# Patient Record
Sex: Female | Born: 1965 | Race: White | Hispanic: No | Marital: Married | Smoking: Never smoker
Health system: Southern US, Community
[De-identification: ages and names within clinical notes are randomized; demographics above are authoritative.]

## PROBLEM LIST (undated history)

## (undated) DIAGNOSIS — G43829 Menstrual migraine, not intractable, without status migrainosus: Secondary | ICD-10-CM

## (undated) DIAGNOSIS — H3530 Unspecified macular degeneration: Secondary | ICD-10-CM

## (undated) DIAGNOSIS — Z9289 Personal history of other medical treatment: Secondary | ICD-10-CM

## (undated) DIAGNOSIS — G4700 Insomnia, unspecified: Secondary | ICD-10-CM

## (undated) DIAGNOSIS — Z8249 Family history of ischemic heart disease and other diseases of the circulatory system: Secondary | ICD-10-CM

## (undated) DIAGNOSIS — Z1211 Encounter for screening for malignant neoplasm of colon: Secondary | ICD-10-CM

## (undated) HISTORY — DX: Personal history of other medical treatment: Z92.89

## (undated) HISTORY — DX: Menstrual migraine, not intractable, without status migrainosus: G43.829

## (undated) HISTORY — DX: Family history of ischemic heart disease and other diseases of the circulatory system: Z82.49

## (undated) HISTORY — DX: Encounter for screening for malignant neoplasm of colon: Z12.11

## (undated) HISTORY — DX: Unspecified macular degeneration: H35.30

## (undated) HISTORY — DX: Insomnia, unspecified: G47.00

---

## 1997-11-22 HISTORY — PX: OPEN REDUCTION INTERNAL FIXATION (ORIF) TIBIA/FIBULA FRACTURE: SHX5992

## 2006-09-15 ENCOUNTER — Ambulatory Visit: Payer: Self-pay

## 2007-09-19 ENCOUNTER — Ambulatory Visit: Payer: Self-pay | Admitting: Obstetrics and Gynecology

## 2008-10-03 ENCOUNTER — Ambulatory Visit: Payer: Self-pay

## 2009-10-07 ENCOUNTER — Ambulatory Visit: Payer: Self-pay

## 2009-10-14 ENCOUNTER — Ambulatory Visit: Payer: Self-pay

## 2010-10-08 ENCOUNTER — Ambulatory Visit: Payer: Self-pay | Admitting: Unknown Physician Specialty

## 2011-10-11 ENCOUNTER — Ambulatory Visit: Payer: Self-pay

## 2012-10-12 ENCOUNTER — Ambulatory Visit: Payer: Self-pay

## 2013-10-16 ENCOUNTER — Ambulatory Visit: Payer: Self-pay

## 2014-10-22 ENCOUNTER — Ambulatory Visit: Payer: Self-pay

## 2015-11-22 LAB — HM PAP SMEAR

## 2016-09-22 ENCOUNTER — Other Ambulatory Visit: Payer: Self-pay | Admitting: Obstetrics and Gynecology

## 2016-09-22 DIAGNOSIS — Z1231 Encounter for screening mammogram for malignant neoplasm of breast: Secondary | ICD-10-CM

## 2016-11-18 ENCOUNTER — Ambulatory Visit
Admission: RE | Admit: 2016-11-18 | Discharge: 2016-11-18 | Disposition: A | Discharge status: Home / Self Care | Payer: 59 | Source: Ambulatory Visit | Attending: Obstetrics and Gynecology | Admitting: Obstetrics and Gynecology

## 2016-11-18 ENCOUNTER — Encounter: Payer: Self-pay | Admitting: Radiology

## 2016-11-18 DIAGNOSIS — Z1231 Encounter for screening mammogram for malignant neoplasm of breast: Secondary | ICD-10-CM | POA: Insufficient documentation

## 2016-11-18 LAB — HM MAMMOGRAPHY

## 2016-12-01 DIAGNOSIS — Z13220 Encounter for screening for lipoid disorders: Secondary | ICD-10-CM | POA: Diagnosis not present

## 2016-12-01 DIAGNOSIS — Z1239 Encounter for other screening for malignant neoplasm of breast: Secondary | ICD-10-CM | POA: Diagnosis not present

## 2016-12-01 DIAGNOSIS — Z0000 Encounter for general adult medical examination without abnormal findings: Secondary | ICD-10-CM | POA: Diagnosis not present

## 2016-12-01 DIAGNOSIS — Z01419 Encounter for gynecological examination (general) (routine) without abnormal findings: Secondary | ICD-10-CM | POA: Diagnosis not present

## 2016-12-02 DIAGNOSIS — H1045 Other chronic allergic conjunctivitis: Secondary | ICD-10-CM | POA: Diagnosis not present

## 2017-03-15 ENCOUNTER — Other Ambulatory Visit: Payer: Self-pay | Admitting: Obstetrics and Gynecology

## 2017-03-15 DIAGNOSIS — Z86018 Personal history of other benign neoplasm: Secondary | ICD-10-CM | POA: Diagnosis not present

## 2017-03-15 DIAGNOSIS — G4700 Insomnia, unspecified: Secondary | ICD-10-CM | POA: Insufficient documentation

## 2017-03-15 DIAGNOSIS — B078 Other viral warts: Secondary | ICD-10-CM | POA: Diagnosis not present

## 2017-03-15 DIAGNOSIS — F5101 Primary insomnia: Secondary | ICD-10-CM

## 2017-03-15 DIAGNOSIS — Z872 Personal history of diseases of the skin and subcutaneous tissue: Secondary | ICD-10-CM | POA: Diagnosis not present

## 2017-03-15 MED ORDER — ZOLPIDEM TARTRATE 10 MG PO TABS: ORAL_TABLET | ORAL | 0 refills | Status: DC

## 2017-06-10 ENCOUNTER — Telehealth: Payer: Self-pay

## 2017-06-10 NOTE — Telephone Encounter (Signed)
Pt called triage line stating she sees [facility] and they think she is going through menopause. She has not had a cycle since January. She has started having bright red and heavy bleeding. No pain. Please advise

## 2017-06-13 NOTE — Telephone Encounter (Signed)
Pt is schedule 06/16/17

## 2017-06-16 ENCOUNTER — Encounter: Payer: Self-pay | Admitting: Obstetrics and Gynecology

## 2017-06-16 ENCOUNTER — Ambulatory Visit (INDEPENDENT_AMBULATORY_CARE_PROVIDER_SITE_OTHER): Payer: 59 | Admitting: Obstetrics and Gynecology

## 2017-06-16 VITALS — BP 118/70 | HR 51 | Ht 63.0 in | Wt 140.0 lb

## 2017-06-16 DIAGNOSIS — N938 Other specified abnormal uterine and vaginal bleeding: Secondary | ICD-10-CM

## 2017-06-16 DIAGNOSIS — N951 Menopausal and female climacteric states: Secondary | ICD-10-CM

## 2017-06-16 DIAGNOSIS — F5101 Primary insomnia: Secondary | ICD-10-CM

## 2017-06-16 MED ORDER — ZOLPIDEM TARTRATE 10 MG PO TABS: ORAL_TABLET | ORAL | 0 refills | Status: DC

## 2017-06-16 NOTE — Progress Notes (Signed)
Chief Complaint  Patient presents with  . Vaginal Bleeding    vaginal bleeding x2 weeks first time since January    HPI:      [name] is a 51 y.o. [id number] who LMP was Patient's last menstrual period was 05/31/2017., presents today for vaginal bleeding. Pt was seen 12/17 for annual and was taken off OCPs since menses were infrequent. Pt had 1/18 withdrawal bleed and then no bleeding until 05/31/17. Bleeding started as spotting for a week and then she had a few days of heavier flow (changing tampons about every 3-4 hrs) with clots. No dysmen.  Bleeding stopped 06/13/17 and she has been fine since. She is sex active, no bleeding with sex. No pelvic pain, occas vasomotor sx.   Past Medical History:  Diagnosis Date  . History of mammogram [id number]; 11/18/16   birads 1; neg  . History of Papanicolaou smear of cervix 08/31/12; 11/22/15   -/-; -/-  . Insomnia   . Menstrual migraine     Past Surgical History:  Procedure Laterality Date  . OPEN REDUCTION INTERNAL FIXATION (ORIF) TIBIA/FIBULA FRACTURE  1999    Family History  Problem Relation Age of Onset  . Alzheimer's disease Mother        doesn't carry genetic gene  . Hyperlipidemia Mother   . Hypertension Mother   . Heart disease Father 70       MI  . CVA Maternal Grandfather   . CVA Paternal Grandfather   . Breast cancer Neg Hx     Social History   Social History  . Marital status: Married    Spouse name: N/A  . Number of children: 2  . Years of education: 14   Occupational History  . OFFICE MANAGER    Social History Main Topics  . Smoking status: Never Smoker  . Smokeless tobacco: Never Used  . Alcohol use No  . Drug use: No  . Sexual activity: Yes   Other Topics Concern  . Not on file   Social History Narrative  . No narrative on file     Current Outpatient Prescriptions:  .  zolpidem (AMBIEN) 10 MG tablet, Take 1/2 tab at bedtime prn sx, Disp: 30 tablet, Rfl: 0   ROS:  Review of  Systems  Constitutional: Negative for fever.  Gastrointestinal: Negative for blood in stool, constipation, diarrhea, nausea and vomiting.  Genitourinary: Positive for menstrual problem. Negative for dyspareunia, dysuria, flank pain, frequency, hematuria, urgency, vaginal bleeding, vaginal discharge and vaginal pain.  Musculoskeletal: Negative for back pain.  Skin: Negative for rash.     OBJECTIVE:   Vitals:  BP 118/70 (BP Location: Left Arm, Patient Position: Sitting, Cuff Size: Normal)   Pulse (!) 51   Ht 5\' 3"  (1.6 m)   Wt 140 lb (63.5 kg)   LMP 05/31/2017   BMI 24.80 kg/m   Physical Exam  Constitutional: She is oriented to person, place, and time and well-developed, well-nourished, and in no distress. Vital signs are normal.  Genitourinary: Vagina normal, uterus normal, cervix normal, right adnexa normal, left adnexa normal and vulva normal. Uterus is not enlarged. Cervix exhibits no motion tenderness and no tenderness. Right adnexum displays no mass and no tenderness. Left adnexum displays no mass and no tenderness. Vulva exhibits no erythema, no exudate, no lesion, no rash and no tenderness. Vagina exhibits no lesion.  Neurological: She is oriented to person, place, and time.  Vitals reviewed.   Assessment/Plan: DUB (dysfunctional uterine bleeding) -  7/18. Sx resolved. Neg exam. Pt is perimenopausal. Will cont to follow sx. If sx recur, will check labs/u/s. Pt to f/u with sx prn.  Perimenopause  Primary insomnia - Rx RF ambien per pt request. - Plan: zolpidem (AMBIEN) 10 MG tablet    Meds ordered this encounter  Medications  . zolpidem (AMBIEN) 10 MG tablet    Sig: Take 1/2 tab at bedtime prn sx    Dispense:  30 tablet    Refill:  0      Return if symptoms worsen or fail to improve.  Alicia B. Copland, PA-C 06/16/2017 1:37 PM

## 2017-07-06 ENCOUNTER — Encounter: Payer: Self-pay | Admitting: Obstetrics and Gynecology

## 2017-08-16 DIAGNOSIS — Z23 Encounter for immunization: Secondary | ICD-10-CM | POA: Diagnosis not present

## 2017-09-08 ENCOUNTER — Other Ambulatory Visit: Payer: Self-pay | Admitting: Obstetrics and Gynecology

## 2017-09-08 DIAGNOSIS — Z1231 Encounter for screening mammogram for malignant neoplasm of breast: Secondary | ICD-10-CM

## 2017-09-08 DIAGNOSIS — F5101 Primary insomnia: Secondary | ICD-10-CM

## 2017-09-08 MED ORDER — ZOLPIDEM TARTRATE 10 MG PO TABS
ORAL_TABLET | ORAL | 0 refills | Status: DC
Start: 2017-09-08 — End: 2017-12-07

## 2017-11-21 ENCOUNTER — Encounter: Payer: Self-pay | Admitting: Obstetrics and Gynecology

## 2017-11-21 ENCOUNTER — Ambulatory Visit (INDEPENDENT_AMBULATORY_CARE_PROVIDER_SITE_OTHER): Payer: 59 | Admitting: Obstetrics and Gynecology

## 2017-11-21 ENCOUNTER — Ambulatory Visit
Admission: RE | Admit: 2017-11-21 | Discharge: 2017-11-21 | Disposition: A | Discharge status: Home / Self Care | Payer: 59 | Source: Ambulatory Visit | Attending: Obstetrics and Gynecology | Admitting: Obstetrics and Gynecology

## 2017-11-21 VITALS — BP 102/70 | HR 66 | Ht 63.0 in | Wt 141.0 lb

## 2017-11-21 DIAGNOSIS — Z1211 Encounter for screening for malignant neoplasm of colon: Secondary | ICD-10-CM

## 2017-11-21 DIAGNOSIS — Z1231 Encounter for screening mammogram for malignant neoplasm of breast: Secondary | ICD-10-CM | POA: Diagnosis not present

## 2017-11-21 DIAGNOSIS — M25512 Pain in left shoulder: Secondary | ICD-10-CM

## 2017-11-21 DIAGNOSIS — G4700 Insomnia, unspecified: Secondary | ICD-10-CM | POA: Diagnosis not present

## 2017-11-21 DIAGNOSIS — Z01419 Encounter for gynecological examination (general) (routine) without abnormal findings: Secondary | ICD-10-CM | POA: Diagnosis not present

## 2017-11-21 DIAGNOSIS — N951 Menopausal and female climacteric states: Secondary | ICD-10-CM | POA: Diagnosis not present

## 2017-11-21 DIAGNOSIS — Z1239 Encounter for other screening for malignant neoplasm of breast: Secondary | ICD-10-CM

## 2017-11-21 LAB — HEMOCCULT GUIAC POC 1CARD (OFFICE): Fecal Occult Blood, POC: NEGATIVE

## 2017-11-21 NOTE — Progress Notes (Addendum)
PCP: Patient, No Pcp Per   Chief Complaint  Patient presents with  . Gynecologic Exam    ?bursitis in shoulder, taking advil, can you rx something stronger?    HPI:      [name] is a 51 y.o. [id number] who LMP was Patient's last menstrual period was 08/05/2017., presents today for her annual examination.  Her menses are irregular due to perimenopause. She does not have intermenstrual bleeding. She had period 1/18, 7/18, 8/18, and 9/18, lasting 4 days. No BTB, dysmen. She is no longer on OCPs and is doing fine off them.   She does have vasomotor sx that are tolerable.  Sex activity: single partner. She does not have vaginal dryness.  Last Pap: November 18, 2015  Results were: no abnormalities /neg HPV DNA.  Hx of STDs: none  Last mammogram: today, Results were: normal--routine follow-up in 12 months There is no FH of breast cancer. There is no FH of ovarian cancer. The patient does not do self-breast exams.  Colonoscopy: declined last yr  Tobacco use: The patient denies current or previous tobacco use. Alcohol use: none Exercise: moderately active  She does not get adequate calcium and Vitamin D in her diet.  She has noted LT shoulder pain since exercising recently. She is taking ibup 800 mg QD to BID with some improvement. She wonders if it's bursitis. She has full ROM but pain with certain movements.   She takes ambien prn insomnia. Doesn't need RF currently.   Past Medical History:  Diagnosis Date  . History of mammogram [id number]; 11/18/16   birads 1; neg  . History of Papanicolaou smear of cervix 08/31/12; 11/22/15   -/-; -/-  . Insomnia   . Menstrual migraine     Past Surgical History:  Procedure Laterality Date  . OPEN REDUCTION INTERNAL FIXATION (ORIF) TIBIA/FIBULA FRACTURE  1999    Family History  Problem Relation Age of Onset  . Alzheimer's disease Mother        doesn't carry genetic gene  . Hyperlipidemia Mother   . Hypertension Mother     . Heart disease Father 23       MI  . CVA Maternal Grandfather   . CVA Paternal Grandfather   . Breast cancer Neg Hx     Social History   Socioeconomic History  . Marital status: Married    Spouse name: Not on file  . Number of children: 2  . Years of education: 47  . Highest education level: Not on file  Social Needs  . Financial resource strain: Not on file  . Food insecurity - worry: Not on file  . Food insecurity - inability: Not on file  . Transportation needs - medical: Not on file  . Transportation needs - non-medical: Not on file  Occupational History  . Occupation: OFFICE MANAGER  Tobacco Use  . Smoking status: Never Smoker  . Smokeless tobacco: Never Used  Substance and Sexual Activity  . Alcohol use: No  . Drug use: No  . Sexual activity: Yes    Birth control/protection: Post-menopausal  Other Topics Concern  . Not on file  Social History Narrative  . Not on file    Current Meds  Medication Sig  . zolpidem (AMBIEN) 10 MG tablet Take 1/2 tab at bedtime prn sx      ROS:  Review of Systems  Constitutional: Negative for fatigue, fever and unexpected weight change.  Respiratory: Negative for cough, shortness of breath and  wheezing.   Cardiovascular: Negative for chest pain, palpitations and leg swelling.  Gastrointestinal: Negative for blood in stool, constipation, diarrhea, nausea and vomiting.  Endocrine: Negative for cold intolerance, heat intolerance and polyuria.  Genitourinary: Negative for dyspareunia, dysuria, flank pain, frequency, genital sores, hematuria, menstrual problem, pelvic pain, urgency, vaginal bleeding, vaginal discharge and vaginal pain.  Musculoskeletal: Positive for arthralgias. Negative for back pain, joint swelling and myalgias.  Skin: Negative for rash.  Neurological: Negative for dizziness, syncope, light-headedness, numbness and headaches.  Hematological: Negative for adenopathy.  Psychiatric/Behavioral: Negative for  agitation, confusion, sleep disturbance and suicidal ideas. The patient is not nervous/anxious.      Objective: BP 102/70   Pulse 66   Ht 5\' 3"  (1.6 m)   Wt 141 lb (64 kg)   LMP 08/05/2017   BMI 24.98 kg/m    Physical Exam  Constitutional: She is oriented to person, place, and time. She appears well-developed and well-nourished.  Genitourinary: Vagina normal and uterus normal. There is no rash or tenderness on the right labia. There is no rash or tenderness on the left labia. No erythema or tenderness in the vagina. No vaginal discharge found. Right adnexum does not display mass and does not display tenderness. Left adnexum does not display mass and does not display tenderness. Cervix does not exhibit motion tenderness or polyp. Uterus is not enlarged or tender.  Neck: Normal range of motion. No thyromegaly present.  Cardiovascular: Normal rate, regular rhythm and normal heart sounds.  No murmur heard. Pulmonary/Chest: Effort normal and breath sounds normal. Right breast exhibits no mass, no nipple discharge, no skin change and no tenderness. Left breast exhibits no mass, no nipple discharge, no skin change and no tenderness.  Abdominal: Soft. There is no tenderness. There is no guarding.  Musculoskeletal: Normal range of motion.  Neurological: She is alert and oriented to person, place, and time. No cranial nerve deficit.  Psychiatric: She has a normal mood and affect. Her behavior is normal.  Vitals reviewed.   Results: Results for orders placed or performed in visit on 11/21/17 (from the past 24 hour(s))  POCT Occult Blood Stool     Status: Normal   Collection Time: 11/21/17  2:38 PM  Result Value Ref Range   Fecal Occult Blood, POC Negative Negative   Card #1 Date     Card #2 Fecal Occult Blod, POC     Card #2 Date     Card #3 Fecal Occult Blood, POC     Card #3 Date      Assessment/Plan:  Encounter for annual routine gynecological examination  Screening for breast  cancer - Pt had mammo today.   Screening for colon cancer - Pt declines colonoscopy. Will consider cologard and f/u if desires ref. - Plan: POCT Occult Blood Stool  Acute pain of left shoulder - D/C exercises that exacerbate sx/ice/Ibup 800 mg TID. F/u with [facility] prn since doesn't have PCP.  Perimenopause - F/u prn DUB.  Insomnia, unspecified type - F/u prn Rx RF ambien.          GYN counsel breast self exam, mammography screening, menopause, adequate intake of calcium and vitamin D, diet and exercise    F/U  Return in about 1 year (around 11/21/2018).  Dash Cardarelli B. Cadee Agro, PA-C 11/21/2017 3:03 PM

## 2017-11-21 NOTE — Patient Instructions (Signed)
I value your feedback and entrusting us with your care. If you get a [facility] patient survey, I would appreciate you taking the time to let us know about your experience today. Thank you! 

## 2017-12-07 ENCOUNTER — Other Ambulatory Visit: Payer: Self-pay | Admitting: Obstetrics and Gynecology

## 2017-12-07 DIAGNOSIS — F5101 Primary insomnia: Secondary | ICD-10-CM

## 2017-12-07 MED ORDER — ZOLPIDEM TARTRATE 10 MG PO TABS: ORAL_TABLET | ORAL | 0 refills | Status: DC

## 2017-12-07 NOTE — Telephone Encounter (Signed)
Please advise 

## 2017-12-07 NOTE — Telephone Encounter (Signed)
Annual was 12/31

## 2018-01-12 IMAGING — MG MM DIGITAL SCREENING BILAT W/ TOMO W/ CAD
9 of 12 series · 9 of 28 positions shown · non-contrast
Comparison: Previous exam(s).

CLINICAL DATA: Screening.

EXAM:
2D DIGITAL SCREENING BILATERAL MAMMOGRAM WITH CAD AND ADJUNCT TOMO

[L CC synth-2D]
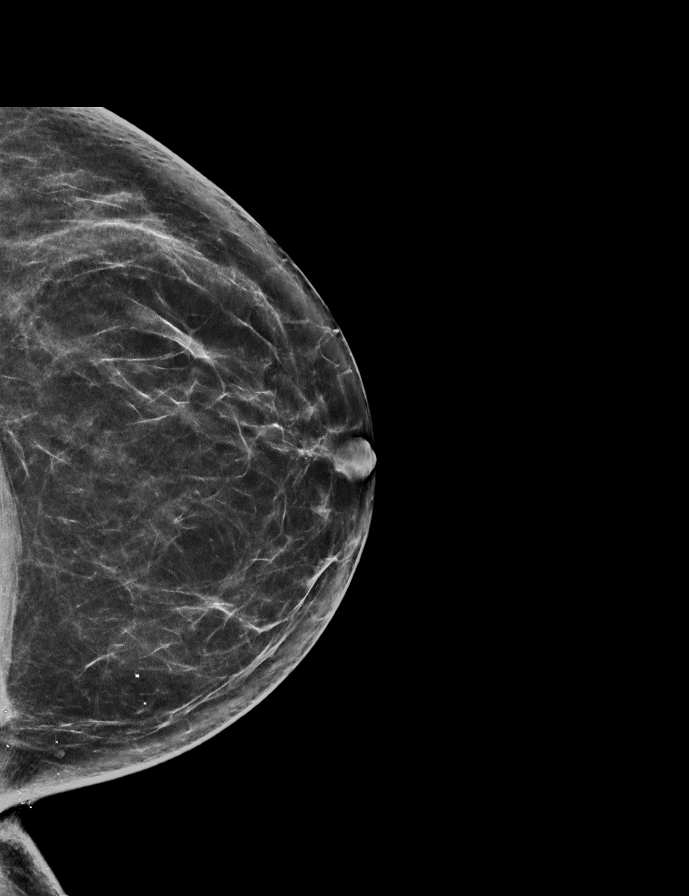

[L MLO]
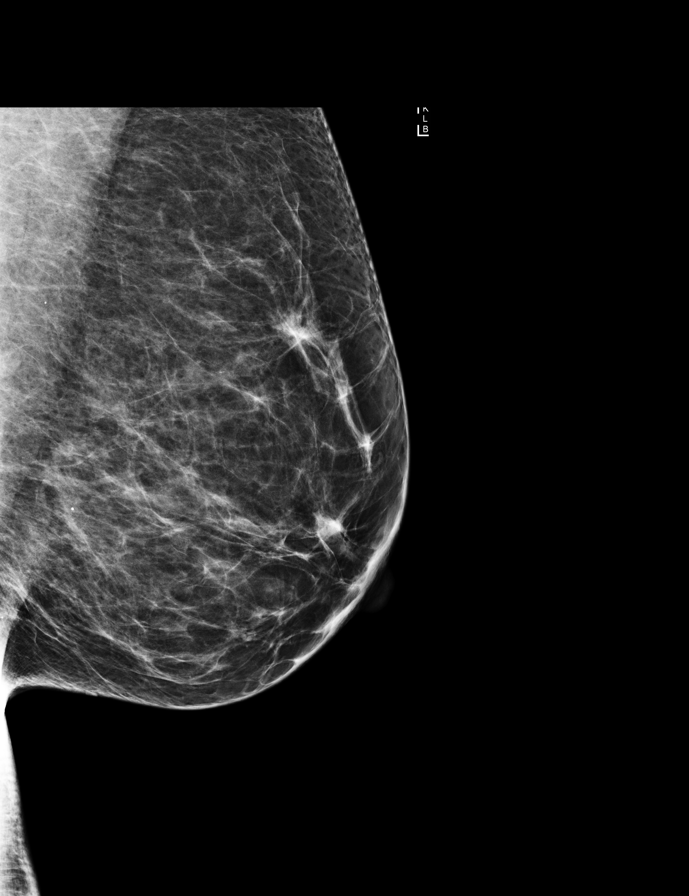

[L CC]
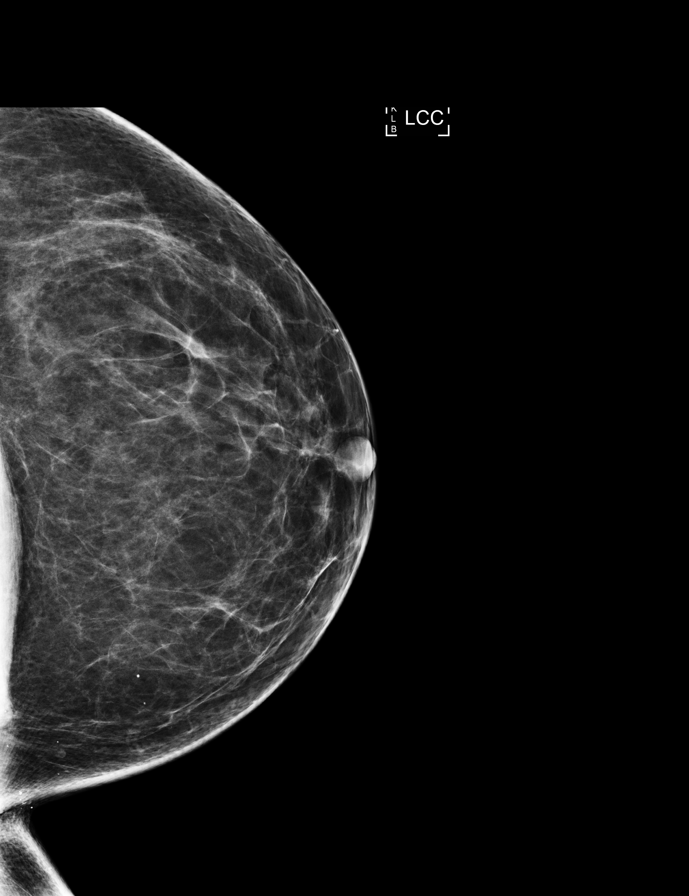

[R CC synth-2D]
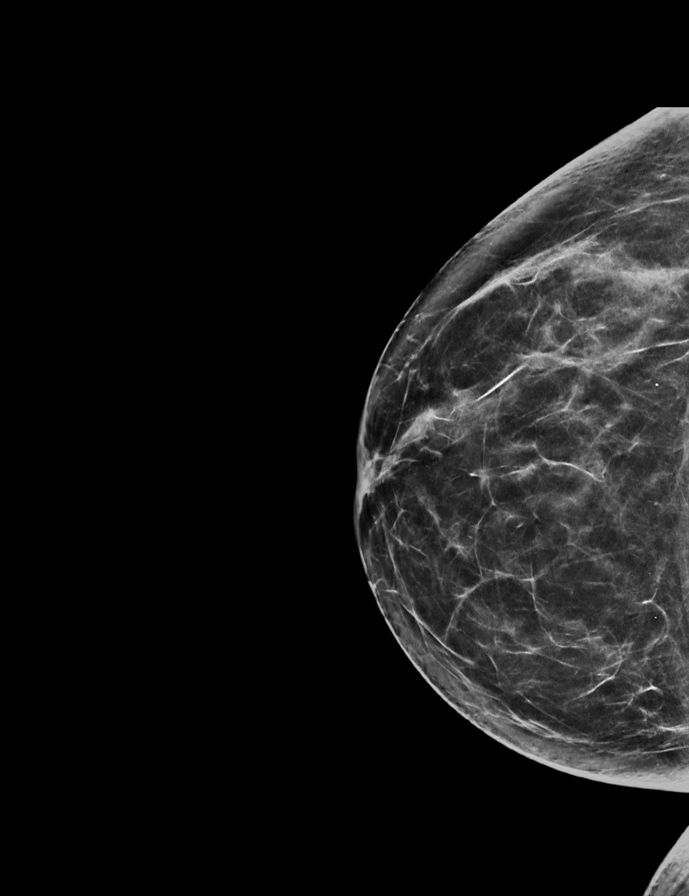

[R MLO]
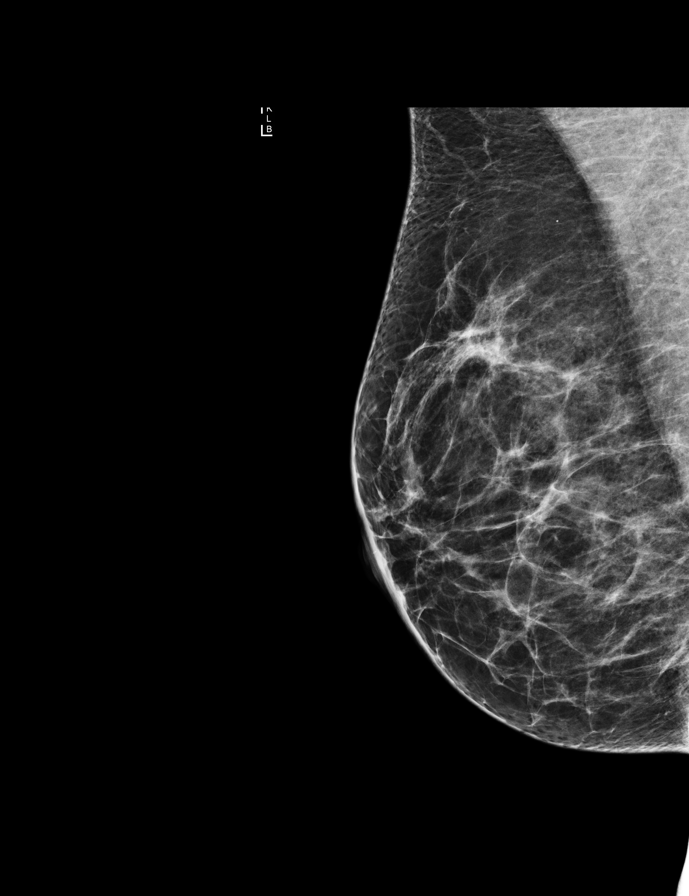

[L MLO synth-2D]
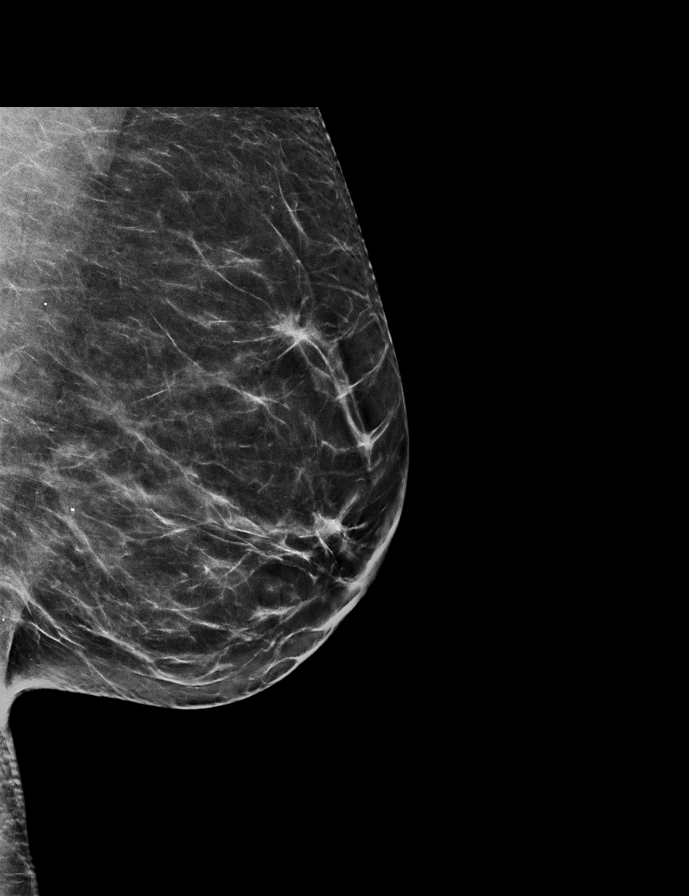

[R MLO synth-2D]
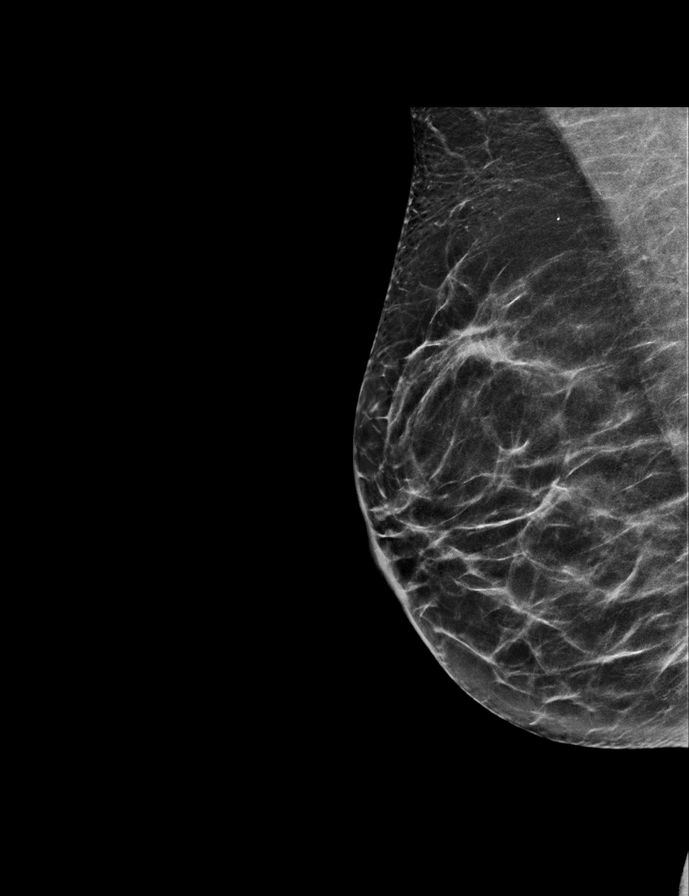

[R CC]
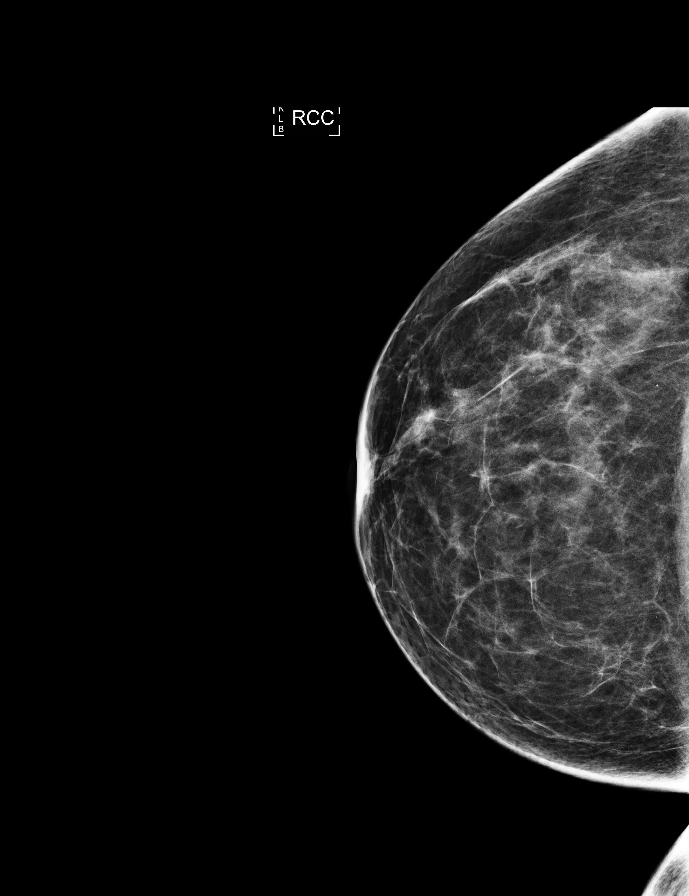

[L MLO tomo · tomo slice 35/70.0]
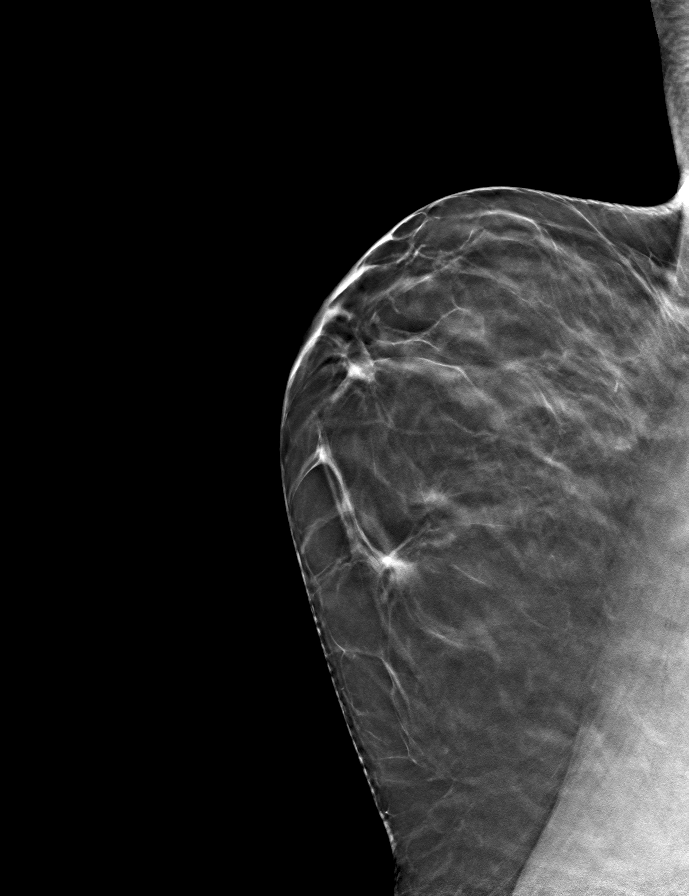

[9 of 28 positions shown; findings below may reference images not displayed]

ACR Breast Density Category b: There are scattered areas of
fibroglandular density.
FINDINGS: There are no findings suspicious for malignancy. Images were
processed with CAD.
IMPRESSION: No mammographic evidence of malignancy. A result letter of this
screening mammogram will be mailed directly to the patient.

RECOMMENDATION:
Screening mammogram in one year. (Code:97-6-RS4)

BI-RADS CATEGORY  1: Negative.

## 2018-02-23 ENCOUNTER — Other Ambulatory Visit: Payer: Self-pay | Admitting: Obstetrics and Gynecology

## 2018-02-23 DIAGNOSIS — F5101 Primary insomnia: Secondary | ICD-10-CM

## 2018-02-23 MED ORDER — ZOLPIDEM TARTRATE 10 MG PO TABS: ORAL_TABLET | ORAL | 0 refills | Status: DC

## 2018-02-23 NOTE — Telephone Encounter (Signed)
Please advise. Thank you

## 2018-03-27 DIAGNOSIS — L578 Other skin changes due to chronic exposure to nonionizing radiation: Secondary | ICD-10-CM | POA: Diagnosis not present

## 2018-03-27 DIAGNOSIS — D485 Neoplasm of uncertain behavior of skin: Secondary | ICD-10-CM | POA: Diagnosis not present

## 2018-03-27 DIAGNOSIS — L281 Prurigo nodularis: Secondary | ICD-10-CM | POA: Diagnosis not present

## 2018-03-27 DIAGNOSIS — Z86018 Personal history of other benign neoplasm: Secondary | ICD-10-CM | POA: Diagnosis not present

## 2018-05-02 ENCOUNTER — Encounter: Payer: Self-pay | Admitting: Obstetrics and Gynecology

## 2018-05-02 DIAGNOSIS — Z131 Encounter for screening for diabetes mellitus: Secondary | ICD-10-CM

## 2018-05-02 DIAGNOSIS — Z13220 Encounter for screening for lipoid disorders: Secondary | ICD-10-CM

## 2018-05-02 DIAGNOSIS — Z0000 Encounter for general adult medical examination without abnormal findings: Secondary | ICD-10-CM

## 2018-05-03 ENCOUNTER — Encounter: Payer: Self-pay | Admitting: Obstetrics and Gynecology

## 2018-05-08 ENCOUNTER — Other Ambulatory Visit: Payer: 59

## 2018-05-08 DIAGNOSIS — Z0000 Encounter for general adult medical examination without abnormal findings: Secondary | ICD-10-CM | POA: Diagnosis not present

## 2018-05-08 DIAGNOSIS — Z13220 Encounter for screening for lipoid disorders: Secondary | ICD-10-CM | POA: Diagnosis not present

## 2018-05-08 DIAGNOSIS — Z131 Encounter for screening for diabetes mellitus: Secondary | ICD-10-CM

## 2018-05-09 LAB — COMPREHENSIVE METABOLIC PANEL
A/G RATIO: 1.7 (ref 1.2–2.2)
ALK PHOS: 46 IU/L (ref 39–117)
ALT: 18 IU/L (ref 0–32)
AST: 22 IU/L (ref 0–40)
Albumin: 4 g/dL (ref 3.5–5.5)
BUN/Creatinine Ratio: 24 — ABNORMAL HIGH (ref 9–23)
BUN: 21 mg/dL (ref 6–24)
Bilirubin Total: <0.2 mg/dL (ref 0.0–1.2)
CALCIUM: 9.5 mg/dL (ref 8.7–10.2)
CHLORIDE: 111 mmol/L — AB (ref 96–106)
CO2: 16 mmol/L — ABNORMAL LOW (ref 20–29)
Creatinine, Ser: 0.89 mg/dL (ref 0.57–1.00)
GFR calc Af Amer: 87 mL/min/{1.73_m2} (ref >59)
GFR, EST NON AFRICAN AMERICAN: 75 mL/min/{1.73_m2} (ref >59)
Globulin, Total: 2.4 g/dL (ref 1.5–4.5)
Glucose: 79 mg/dL (ref 65–99)
POTASSIUM: 5 mmol/L (ref 3.5–5.2)
Sodium: 145 mmol/L — ABNORMAL HIGH (ref 134–144)
Total Protein: 6.4 g/dL (ref 6.0–8.5)

## 2018-05-09 LAB — LIPID PANEL
Chol/HDL Ratio: 2.9 ratio (ref 0.0–4.4)
Cholesterol, Total: 148 mg/dL (ref 100–199)
HDL: 51 mg/dL (ref >39)
LDL Calculated: 84 mg/dL (ref 0–99)
Triglycerides: 64 mg/dL (ref 0–149)
VLDL CHOLESTEROL CAL: 13 mg/dL (ref 5–40)

## 2018-05-09 LAB — HEMOGLOBIN A1C
Est. average glucose Bld gHb Est-mCnc: 111 mg/dL
HEMOGLOBIN A1C: 5.5 % (ref 4.8–5.6)

## 2018-05-15 ENCOUNTER — Other Ambulatory Visit: Payer: Self-pay | Admitting: Obstetrics and Gynecology

## 2018-05-15 DIAGNOSIS — F5101 Primary insomnia: Secondary | ICD-10-CM

## 2018-05-15 MED ORDER — ZOLPIDEM TARTRATE 10 MG PO TABS: ORAL_TABLET | ORAL | 0 refills | Status: DC

## 2018-05-15 NOTE — Telephone Encounter (Signed)
Please advise 

## 2018-06-01 DIAGNOSIS — H353131 Nonexudative age-related macular degeneration, bilateral, early dry stage: Secondary | ICD-10-CM | POA: Diagnosis not present

## 2018-07-05 DIAGNOSIS — H2513 Age-related nuclear cataract, bilateral: Secondary | ICD-10-CM | POA: Diagnosis not present

## 2018-07-05 DIAGNOSIS — H353111 Nonexudative age-related macular degeneration, right eye, early dry stage: Secondary | ICD-10-CM | POA: Diagnosis not present

## 2018-08-08 ENCOUNTER — Other Ambulatory Visit: Payer: Self-pay | Admitting: Obstetrics and Gynecology

## 2018-08-08 DIAGNOSIS — F5101 Primary insomnia: Secondary | ICD-10-CM

## 2018-08-08 MED ORDER — ZOLPIDEM TARTRATE 10 MG PO TABS: ORAL_TABLET | ORAL | 0 refills | Status: DC

## 2018-08-08 NOTE — Telephone Encounter (Signed)
Please advise 

## 2018-08-08 NOTE — Progress Notes (Signed)
Rx RF. 

## 2018-12-08 ENCOUNTER — Other Ambulatory Visit: Payer: Self-pay | Admitting: Obstetrics and Gynecology

## 2018-12-08 DIAGNOSIS — F5101 Primary insomnia: Secondary | ICD-10-CM

## 2018-12-08 NOTE — Telephone Encounter (Signed)
Please advise 

## 2018-12-08 NOTE — Telephone Encounter (Signed)
advise

## 2018-12-11 ENCOUNTER — Other Ambulatory Visit: Payer: Self-pay | Admitting: Obstetrics and Gynecology

## 2018-12-23 DIAGNOSIS — Z1211 Encounter for screening for malignant neoplasm of colon: Secondary | ICD-10-CM

## 2018-12-23 HISTORY — DX: Encounter for screening for malignant neoplasm of colon: Z12.11

## 2019-01-08 NOTE — Progress Notes (Signed)
PCP: Patient, No Pcp Per   Chief Complaint  Patient presents with  . Gynecologic Exam    HPI:      [name] is a 53 y.o. [phone] who LMP was No LMP recorded. (Menstrual status: Perimenopausal)., presents today for her annual examination.  Her menses are absent due to menopause now. She does not have intermenstrual bleeding. LMP 9/18.  She does have vasomotor sx that are tolerable.  Sex activity: single partner. She does not have vaginal dryness.  Last Pap: November 18, 2015  Results were: no abnormalities /neg HPV DNA.  Hx of STDs: none  Last mammogram: 11/21/17  Results were: normal--routine follow-up in 12 months There is no FH of breast cancer. There is no FH of ovarian cancer. The patient does not do self-breast exams.  Colonoscopy: declined last visit, interested in Cologuard. No FH colon cancer/no rectal bleeding.  Tobacco use: The patient denies current or previous tobacco use. Alcohol use: none Exercise: moderately active  She does not get adequate calcium and Vitamin D in her diet.  She takes ambien prn insomnia and needs RF currently.  Normal lipids/labs 6/19.   Past Medical History:  Diagnosis Date  . History of mammogram [id number]; 11/18/16   birads 1; neg  . History of Papanicolaou smear of cervix 08/31/12; 11/22/15   -/-; -/-  . Insomnia   . Menstrual migraine     Past Surgical History:  Procedure Laterality Date  . OPEN REDUCTION INTERNAL FIXATION (ORIF) TIBIA/FIBULA FRACTURE  1999    Family History  Problem Relation Age of Onset  . Alzheimer's disease Mother        doesn't carry genetic gene  . Hyperlipidemia Mother   . Hypertension Mother   . Heart disease Father 28       MI  . CVA Maternal Grandfather   . CVA Paternal Grandfather   . Breast cancer Neg Hx     Social History   Socioeconomic History  . Marital status: Married    Spouse name: Not on file  . Number of children: 2  . Years of education: 47  . Highest  education level: Not on file  Occupational History  . Occupation: OFFICE MANAGER  Social Needs  . Financial resource strain: Not on file  . Food insecurity:    Worry: Not on file    Inability: Not on file  . Transportation needs:    Medical: Not on file    Non-medical: Not on file  Tobacco Use  . Smoking status: Never Smoker  . Smokeless tobacco: Never Used  Substance and Sexual Activity  . Alcohol use: No  . Drug use: No  . Sexual activity: Yes    Birth control/protection: Post-menopausal  Lifestyle  . Physical activity:    Days per week: Not on file    Minutes per session: Not on file  . Stress: Not on file  Relationships  . Social connections:    Talks on phone: Not on file    Gets together: Not on file    Attends religious service: Not on file    Active member of club or organization: Not on file    Attends meetings of clubs or organizations: Not on file    Relationship status: Not on file  . Intimate partner violence:    Fear of current or ex partner: Not on file    Emotionally abused: Not on file    Physically abused: Not on file  Forced sexual activity: Not on file  Other Topics Concern  . Not on file  Social History Narrative  . Not on file    Current Meds  Medication Sig  . meloxicam (MOBIC) 15 MG tablet meloxicam 15 mg tablet  TAKE 1 TABLET BY MOUTH EVERY DAY  . zolpidem (AMBIEN) 10 MG tablet Take 1/2 tab at bedtime prn sx  . [DISCONTINUED] zolpidem (AMBIEN) 10 MG tablet Take 1/2 tab at bedtime prn sx      ROS:  Review of Systems  Constitutional: Negative for fatigue, fever and unexpected weight change.  Respiratory: Negative for cough, shortness of breath and wheezing.   Cardiovascular: Negative for chest pain, palpitations and leg swelling.  Gastrointestinal: Negative for blood in stool, constipation, diarrhea, nausea and vomiting.  Endocrine: Negative for cold intolerance, heat intolerance and polyuria.  Genitourinary: Negative for  dyspareunia, dysuria, flank pain, frequency, genital sores, hematuria, menstrual problem, pelvic pain, urgency, vaginal bleeding, vaginal discharge and vaginal pain.  Musculoskeletal: Negative for arthralgias, back pain, joint swelling and myalgias.  Skin: Negative for rash.  Neurological: Negative for dizziness, syncope, light-headedness, numbness and headaches.  Hematological: Negative for adenopathy.  Psychiatric/Behavioral: Negative for agitation, confusion, sleep disturbance and suicidal ideas. The patient is not nervous/anxious.      Objective: BP 118/78   Pulse (!) 54   Ht 5\' 3"  (1.6 m)   Wt 148 lb (67.1 kg)   BMI 26.22 kg/m    Physical Exam Constitutional:      Appearance: She is well-developed.  Genitourinary:     Vulva, vagina, uterus, right adnexa and left adnexa normal.     No vulval lesion or tenderness noted.     No vaginal discharge, erythema or tenderness.     No cervical motion tenderness or polyp.     Uterus is not enlarged or tender.     No right or left adnexal mass present.     Right adnexa not tender.     Left adnexa not tender.  Neck:     Musculoskeletal: Normal range of motion.     Thyroid: No thyromegaly.  Cardiovascular:     Rate and Rhythm: Normal rate and regular rhythm.     Heart sounds: Normal heart sounds. No murmur.  Pulmonary:     Effort: Pulmonary effort is normal.     Breath sounds: Normal breath sounds.  Chest:     Breasts:        Right: No mass, nipple discharge, skin change or tenderness.        Left: No mass, nipple discharge, skin change or tenderness.  Abdominal:     Palpations: Abdomen is soft.     Tenderness: There is no abdominal tenderness. There is no guarding.  Musculoskeletal: Normal range of motion.  Neurological:     Mental Status: She is alert and oriented to person, place, and time.     Cranial Nerves: No cranial nerve deficit.  Psychiatric:        Behavior: Behavior normal.  Vitals signs reviewed.      Assessment/Plan:  Encounter for annual routine gynecological examination  Cervical cancer screening - Plan: Cytology - PAP  Screening for HPV (human papillomavirus) - Plan: Cytology - PAP  Screening for breast cancer - Pt to sched mammo - Plan: MM 3D SCREEN BREAST BILATERAL  Screening for colon cancer - Cologuard ref sent.  - Plan: Cologuard  Primary insomnia - Rx RF ambien per pt request. - Plan: zolpidem (AMBIEN) 10 MG tablet  Meds ordered this encounter  Medications  . zolpidem (AMBIEN) 10 MG tablet    Sig: Take 1/2 tab at bedtime prn sx    Dispense:  30 tablet    Refill:  0    Order Specific Question:   Supervising Provider    Answer:   Gae Dry [[id number]]    GYN counsel breast self exam, mammography screening, menopause, adequate intake of calcium and vitamin D, diet and exercise    F/U  Return in about 1 year (around 01/10/2020).  Genell Thede B. Keisean Skowron, PA-C 01/09/2019 9:13 AM

## 2019-01-08 NOTE — Patient Instructions (Addendum)
I value your feedback and entrusting us with your care. If you get a [facility] patient survey, I would appreciate you taking the time to let us know about your experience today. Thank you!  [facility]: [phone]    

## 2019-01-09 ENCOUNTER — Other Ambulatory Visit (HOSPITAL_COMMUNITY)
Admission: RE | Admit: 2019-01-09 | Discharge: 2019-01-09 | Disposition: A | Discharge status: Home / Self Care | Payer: 59 | Source: Ambulatory Visit | Attending: Obstetrics and Gynecology | Admitting: Obstetrics and Gynecology

## 2019-01-09 ENCOUNTER — Encounter: Payer: Self-pay | Admitting: Obstetrics and Gynecology

## 2019-01-09 ENCOUNTER — Ambulatory Visit (INDEPENDENT_AMBULATORY_CARE_PROVIDER_SITE_OTHER): Payer: 59 | Admitting: Obstetrics and Gynecology

## 2019-01-09 VITALS — BP 118/78 | HR 54 | Ht 63.0 in | Wt 148.0 lb

## 2019-01-09 DIAGNOSIS — Z124 Encounter for screening for malignant neoplasm of cervix: Secondary | ICD-10-CM | POA: Insufficient documentation

## 2019-01-09 DIAGNOSIS — Z1151 Encounter for screening for human papillomavirus (HPV): Secondary | ICD-10-CM | POA: Diagnosis present

## 2019-01-09 DIAGNOSIS — Z01419 Encounter for gynecological examination (general) (routine) without abnormal findings: Secondary | ICD-10-CM

## 2019-01-09 DIAGNOSIS — Z1239 Encounter for other screening for malignant neoplasm of breast: Secondary | ICD-10-CM

## 2019-01-09 DIAGNOSIS — Z1211 Encounter for screening for malignant neoplasm of colon: Secondary | ICD-10-CM

## 2019-01-09 DIAGNOSIS — F5101 Primary insomnia: Secondary | ICD-10-CM

## 2019-01-09 MED ORDER — ZOLPIDEM TARTRATE 10 MG PO TABS: ORAL_TABLET | ORAL | 0 refills | Status: DC

## 2019-01-10 LAB — CYTOLOGY - PAP
Adequacy: ABSENT
Diagnosis: NEGATIVE
HPV: NOT DETECTED

## 2019-01-18 LAB — COLOGUARD: Cologuard: NEGATIVE

## 2019-01-22 ENCOUNTER — Encounter: Payer: Self-pay | Admitting: Obstetrics and Gynecology

## 2019-02-28 ENCOUNTER — Encounter: Payer: Self-pay | Admitting: Obstetrics and Gynecology

## 2019-03-01 MED ORDER — PROGESTERONE MICRONIZED 100 MG PO CAPS: ORAL_CAPSULE | ORAL | 1 refills | Status: DC

## 2019-03-01 NOTE — Telephone Encounter (Signed)
Spoke with pt. Having increased hot flashes during the day. Tried estroven for 30 days with minimal sx change. Would like to try HRT. Hx of insomnia. Will try prometrium 100 mg QHS 6 nights on, 1 night off. Rx for 2 months. F/u via phone in 2 mos re: sx. May need to adjust Rx prn.

## 2019-04-02 ENCOUNTER — Other Ambulatory Visit: Payer: Self-pay | Admitting: Obstetrics and Gynecology

## 2019-04-02 ENCOUNTER — Encounter: Payer: Self-pay | Admitting: Obstetrics and Gynecology

## 2019-04-02 DIAGNOSIS — F5101 Primary insomnia: Secondary | ICD-10-CM

## 2019-04-02 MED ORDER — ZOLPIDEM TARTRATE 10 MG PO TABS: ORAL_TABLET | ORAL | 0 refills | Status: DC

## 2019-04-02 NOTE — Progress Notes (Signed)
Rx RF ambien.  

## 2019-04-25 ENCOUNTER — Other Ambulatory Visit: Payer: Self-pay | Admitting: Obstetrics and Gynecology

## 2019-05-03 ENCOUNTER — Other Ambulatory Visit: Payer: Self-pay | Admitting: Obstetrics and Gynecology

## 2019-05-03 ENCOUNTER — Encounter: Payer: Self-pay | Admitting: Obstetrics and Gynecology

## 2019-05-03 MED ORDER — PROGESTERONE MICRONIZED 100 MG PO CAPS: ORAL_CAPSULE | ORAL | 2 refills | Status: DC

## 2019-05-03 NOTE — Progress Notes (Signed)
Rx RF prometrium for vasomotor sx.

## 2019-07-16 ENCOUNTER — Other Ambulatory Visit: Payer: Self-pay | Admitting: Obstetrics and Gynecology

## 2019-07-16 DIAGNOSIS — F5101 Primary insomnia: Secondary | ICD-10-CM

## 2019-07-17 MED ORDER — ZOLPIDEM TARTRATE 10 MG PO TABS: ORAL_TABLET | ORAL | 0 refills | Status: DC

## 2019-09-24 ENCOUNTER — Encounter: Payer: Self-pay | Admitting: Obstetrics and Gynecology

## 2019-10-10 ENCOUNTER — Other Ambulatory Visit: Payer: Self-pay | Admitting: Obstetrics and Gynecology

## 2019-10-10 DIAGNOSIS — F5101 Primary insomnia: Secondary | ICD-10-CM

## 2019-10-10 MED ORDER — ZOLPIDEM TARTRATE 10 MG PO TABS: ORAL_TABLET | ORAL | 0 refills | Status: DC

## 2019-10-10 NOTE — Telephone Encounter (Signed)
Please advise 

## 2019-10-15 ENCOUNTER — Other Ambulatory Visit: Payer: Self-pay | Admitting: Obstetrics and Gynecology

## 2019-10-15 ENCOUNTER — Encounter: Payer: Self-pay | Admitting: Obstetrics and Gynecology

## 2019-10-15 MED ORDER — PROGESTERONE MICRONIZED 100 MG PO CAPS: ORAL_CAPSULE | ORAL | 1 refills | Status: DC

## 2019-10-15 NOTE — Progress Notes (Signed)
Rx RF progesterone for 150 mg dose, alternate 100 and 200 mg doses at night. Improve vasomotor sx.

## 2020-01-15 DIAGNOSIS — N951 Menopausal and female climacteric states: Secondary | ICD-10-CM | POA: Insufficient documentation

## 2020-01-15 NOTE — Patient Instructions (Addendum)
I value your feedback and entrusting us with your care. If you get a [facility] patient survey, I would appreciate you taking the time to let us know about your experience today. Thank you!  As of November 01, 2019, your lab results will be released to your MyChart immediately, before I even have a chance to see them. Please give me time to review them and contact you if there are any abnormalities. Thank you for your patience.   [facility]: [phone]  [facility]: [phone]  

## 2020-01-15 NOTE — Progress Notes (Signed)
PCP: Patient, No Pcp Per   Chief Complaint  Patient presents with  . Gynecologic Exam    HPI:      [name] is a 54 y.o. [phone] who LMP was No LMP recorded. (Menstrual status: Perimenopausal)., presents today for her annual examination.  Her menses are absent due to menopause. She does not have intermenstrual bleeding. LMP 9/18.  She does have vasomotor sx that are improved with prometrium 150 mg dose (had to increase from 100 mg dose). Started 11/20.  Sex activity: single partner. She does not have vaginal dryness.  Last Pap: 01/09/19  Results were: no abnormalities /neg HPV DNA.  Hx of STDs: none  Last mammogram: 11/21/17  Results were: normal--routine follow-up in 12 months There is no FH of breast cancer. There is no FH of ovarian cancer. The patient does not do self-breast exams.  Colonoscopy: neg Cologuard 2/20, repeat after 3 yrs  Tobacco use: The patient denies current or previous tobacco use. Alcohol use: none  No drug use Exercise: moderately active  She does not get adequate calcium and Vitamin D in her diet.  She takes [place] about 5 nights a week for insomnia and needs RF currently.  Normal lipids/labs 6/19. FH MI in her dad at age 65. Pt concerned about CVD risk.   Past Medical History:  Diagnosis Date  . Family history of heart disease in female family member before age 54   . History of mammogram [id number]; 11/18/16   birads 1; neg  . History of Papanicolaou smear of cervix 08/31/12; 11/22/15   -/-; -/-  . Insomnia   . Macular degeneration   . Menstrual migraine   . Screening for colon cancer 12/2018   neg Cologuard, repeat in 3 yrs    Past Surgical History:  Procedure Laterality Date  . OPEN REDUCTION INTERNAL FIXATION (ORIF) TIBIA/FIBULA FRACTURE  1999    Family History  Problem Relation Age of Onset  . Alzheimer's disease Mother        doesn't carry genetic gene  . Hyperlipidemia Mother   . Hypertension Mother   . Heart disease  Father 7       MI  . CVA Maternal Grandfather   . CVA Paternal Grandfather   . Breast cancer Neg Hx     Social History   Socioeconomic History  . Marital status: Married    Spouse name: Not on file  . Number of children: 2  . Years of education: 49  . Highest education level: Not on file  Occupational History  . Occupation: OFFICE MANAGER  Tobacco Use  . Smoking status: Never Smoker  . Smokeless tobacco: Never Used  Substance and Sexual Activity  . Alcohol use: No  . Drug use: No  . Sexual activity: Yes    Birth control/protection: Post-menopausal  Other Topics Concern  . Not on file  Social History Narrative  . Not on file   Social Determinants of Health   Financial Resource Strain:   . Difficulty of Paying Living Expenses: Not on file  Food Insecurity:   . Worried About Charity fundraiser in the Last Year: Not on file  . Ran Out of Food in the Last Year: Not on file  Transportation Needs:   . Lack of Transportation (Medical): Not on file  . Lack of Transportation (Non-Medical): Not on file  Physical Activity:   . Days of Exercise per Week: Not on file  . Minutes of Exercise per  Session: Not on file  Stress:   . Feeling of Stress : Not on file  Social Connections:   . Frequency of Communication with Friends and Family: Not on file  . Frequency of Social Gatherings with Friends and Family: Not on file  . Attends Religious Services: Not on file  . Active Member of Clubs or Organizations: Not on file  . Attends Archivist Meetings: Not on file  . Marital Status: Not on file  Intimate Partner Violence:   . Fear of Current or Ex-Partner: Not on file  . Emotionally Abused: Not on file  . Physically Abused: Not on file  . Sexually Abused: Not on file    Current Meds  Medication Sig  . meloxicam (MOBIC) 15 MG tablet meloxicam 15 mg tablet  TAKE 1 TABLET BY MOUTH EVERY DAY  . [DISCONTINUED] progesterone (PROMETRIUM) 100 MG capsule Take 1-2 capsules  nightly, 6 nights on, 1 night off  . [DISCONTINUED] zolpidem (AMBIEN) 10 MG tablet Take 1/2 tab at bedtime prn sx  . progesterone (PROMETRIUM) 100 MG capsule Take 1-2 capsules nightly, 6 nights on, 1 night off  . zolpidem (AMBIEN) 10 MG tablet Take 1/2 tab at bedtime prn sx      ROS:  Review of Systems  Constitutional: Negative for fatigue, fever and unexpected weight change.  Respiratory: Negative for cough, shortness of breath and wheezing.   Cardiovascular: Negative for chest pain, palpitations and leg swelling.  Gastrointestinal: Negative for blood in stool, constipation, diarrhea, nausea and vomiting.  Endocrine: Negative for cold intolerance, heat intolerance and polyuria.  Genitourinary: Negative for dyspareunia, dysuria, flank pain, frequency, genital sores, hematuria, menstrual problem, pelvic pain, urgency, vaginal bleeding, vaginal discharge and vaginal pain.  Musculoskeletal: Negative for arthralgias, back pain, joint swelling and myalgias.  Skin: Negative for rash.  Neurological: Negative for dizziness, syncope, light-headedness, numbness and headaches.  Hematological: Negative for adenopathy.  Psychiatric/Behavioral: Negative for agitation, confusion, sleep disturbance and suicidal ideas. The patient is not nervous/anxious.      Objective: BP 100/70   Ht 5\' 3"  (1.6 m)   Wt 149 lb (67.6 kg)   BMI 26.39 kg/m    Physical Exam Constitutional:      Appearance: She is well-developed.  Genitourinary:     Vulva, vagina, uterus, right adnexa and left adnexa normal.     No vulval lesion or tenderness noted.     No vaginal discharge, erythema or tenderness.     No cervical motion tenderness or polyp.     Uterus is not enlarged or tender.     No right or left adnexal mass present.     Right adnexa not tender.     Left adnexa not tender.  Neck:     Thyroid: No thyromegaly.  Cardiovascular:     Rate and Rhythm: Normal rate and regular rhythm.     Heart sounds: Normal  heart sounds. No murmur.  Pulmonary:     Effort: Pulmonary effort is normal.     Breath sounds: Normal breath sounds.  Chest:     Breasts:        Right: No mass, nipple discharge, skin change or tenderness.        Left: No mass, nipple discharge, skin change or tenderness.  Abdominal:     Palpations: Abdomen is soft.     Tenderness: There is no abdominal tenderness. There is no guarding.  Musculoskeletal:        General: Normal range of motion.  Cervical back: Normal range of motion.  Neurological:     General: No focal deficit present.     Mental Status: She is alert and oriented to person, place, and time.     Cranial Nerves: No cranial nerve deficit.  Skin:    General: Skin is warm and dry.  Psychiatric:        Mood and Affect: Mood normal.        Behavior: Behavior normal.        Thought Content: Thought content normal.        Judgment: Judgment normal.  Vitals reviewed.     Assessment/Plan:  Encounter for annual routine gynecological examination  Encounter for screening mammogram for malignant neoplasm of breast - Plan: MM 3D SCREEN BREAST BILATERAL; pt to sched mammo  Primary insomnia - Plan: zolpidem (AMBIEN) 10 MG tablet; Rx RF.  Hormone replacement therapy (HRT) - Plan: progesterone (PROMETRIUM) 100 MG capsule; Doing well with HRT. Rx RF. F/u prn.  Vasomotor symptoms due to menopause - Plan: progesterone (PROMETRIUM) 100 MG capsule  Family history of heart disease in female family member before age 58 - Plan: Ambulatory referral to Cardiology; pt's husband sees Dr. Nehemiah Massed and wants to go there   Blood tests for routine general physical examination - Plan: Comprehensive metabolic panel, Lipid panel, TSH  Screening cholesterol level - Plan: Lipid panel  Thyroid disorder screening - Plan: TSH         Meds ordered this encounter  Medications  . progesterone (PROMETRIUM) 100 MG capsule    Sig: Take 1-2 capsules nightly, 6 nights on, 1 night off    Dispense:   150 capsule    Refill:  3    Pls hold Rx for now    Order Specific Question:   Supervising Provider    Answer:   Gae Dry [id number]  . zolpidem (AMBIEN) 10 MG tablet    Sig: Take 1/2 tab at bedtime prn sx    Dispense:  30 tablet    Refill:  3    Order Specific Question:   Supervising Provider    Answer:   Gae Dry [id number]    GYN counsel breast self exam, mammography screening, menopause, adequate intake of calcium and vitamin D, diet and exercise    F/U  Return in about 1 year (around 01/15/2021).  Belmira Daley B. Allis Quirarte, PA-C 01/16/2020 9:12 AM

## 2020-01-16 ENCOUNTER — Encounter: Payer: Self-pay | Admitting: Obstetrics and Gynecology

## 2020-01-16 ENCOUNTER — Other Ambulatory Visit: Payer: Self-pay

## 2020-01-16 ENCOUNTER — Ambulatory Visit (INDEPENDENT_AMBULATORY_CARE_PROVIDER_SITE_OTHER): Payer: 59 | Admitting: Obstetrics and Gynecology

## 2020-01-16 VITALS — BP 100/70 | Ht 63.0 in | Wt 149.0 lb

## 2020-01-16 DIAGNOSIS — F5101 Primary insomnia: Secondary | ICD-10-CM | POA: Diagnosis not present

## 2020-01-16 DIAGNOSIS — N951 Menopausal and female climacteric states: Secondary | ICD-10-CM

## 2020-01-16 DIAGNOSIS — Z01419 Encounter for gynecological examination (general) (routine) without abnormal findings: Secondary | ICD-10-CM | POA: Diagnosis not present

## 2020-01-16 DIAGNOSIS — Z1231 Encounter for screening mammogram for malignant neoplasm of breast: Secondary | ICD-10-CM

## 2020-01-16 DIAGNOSIS — Z79890 Hormone replacement therapy (postmenopausal): Secondary | ICD-10-CM

## 2020-01-16 DIAGNOSIS — Z8249 Family history of ischemic heart disease and other diseases of the circulatory system: Secondary | ICD-10-CM

## 2020-01-16 DIAGNOSIS — Z13220 Encounter for screening for lipoid disorders: Secondary | ICD-10-CM

## 2020-01-16 DIAGNOSIS — Z1329 Encounter for screening for other suspected endocrine disorder: Secondary | ICD-10-CM

## 2020-01-16 DIAGNOSIS — Z0000 Encounter for general adult medical examination without abnormal findings: Secondary | ICD-10-CM

## 2020-01-16 MED ORDER — ZOLPIDEM TARTRATE 10 MG PO TABS: ORAL_TABLET | ORAL | 3 refills | Status: DC

## 2020-01-16 MED ORDER — PROGESTERONE MICRONIZED 100 MG PO CAPS: ORAL_CAPSULE | ORAL | 3 refills | Status: DC

## 2020-01-17 LAB — COMPREHENSIVE METABOLIC PANEL
ALT: 18 IU/L (ref 0–32)
AST: 22 IU/L (ref 0–40)
Albumin/Globulin Ratio: 2.4 — ABNORMAL HIGH (ref 1.2–2.2)
Albumin: 4.7 g/dL (ref 3.8–4.9)
Alkaline Phosphatase: 63 IU/L (ref 39–117)
BUN/Creatinine Ratio: 22 (ref 9–23)
BUN: 16 mg/dL (ref 6–24)
Bilirubin Total: 0.4 mg/dL (ref 0.0–1.2)
CO2: 22 mmol/L (ref 20–29)
Calcium: 9.5 mg/dL (ref 8.7–10.2)
Chloride: 108 mmol/L — ABNORMAL HIGH (ref 96–106)
Creatinine, Ser: 0.74 mg/dL (ref 0.57–1.00)
GFR calc Af Amer: 107 mL/min/{1.73_m2} (ref >59)
GFR calc non Af Amer: 93 mL/min/{1.73_m2} (ref >59)
Globulin, Total: 2 g/dL (ref 1.5–4.5)
Glucose: 81 mg/dL (ref 65–99)
Potassium: 4.2 mmol/L (ref 3.5–5.2)
Sodium: 145 mmol/L — ABNORMAL HIGH (ref 134–144)
Total Protein: 6.7 g/dL (ref 6.0–8.5)

## 2020-01-17 LAB — LIPID PANEL
Chol/HDL Ratio: 3.5 ratio (ref 0.0–4.4)
Cholesterol, Total: 188 mg/dL (ref 100–199)
HDL: 53 mg/dL (ref >39)
LDL Chol Calc (NIH): 123 mg/dL — ABNORMAL HIGH (ref 0–99)
Triglycerides: 67 mg/dL (ref 0–149)
VLDL Cholesterol Cal: 12 mg/dL (ref 5–40)

## 2020-01-17 LAB — TSH: TSH: 1.54 u[IU]/mL (ref 0.450–4.500)

## 2020-02-01 ENCOUNTER — Ambulatory Visit
Admission: RE | Admit: 2020-02-01 | Discharge: 2020-02-01 | Disposition: A | Discharge status: Home / Self Care | Payer: 59 | Source: Ambulatory Visit | Attending: Obstetrics and Gynecology | Admitting: Obstetrics and Gynecology

## 2020-02-01 ENCOUNTER — Encounter: Payer: Self-pay | Admitting: Radiology

## 2020-02-01 DIAGNOSIS — Z1231 Encounter for screening mammogram for malignant neoplasm of breast: Secondary | ICD-10-CM | POA: Diagnosis not present

## 2020-02-03 ENCOUNTER — Encounter: Payer: Self-pay | Admitting: Obstetrics and Gynecology

## 2020-02-09 ENCOUNTER — Other Ambulatory Visit: Payer: Self-pay

## 2020-02-09 ENCOUNTER — Ambulatory Visit: Payer: 59 | Attending: Internal Medicine

## 2020-02-09 DIAGNOSIS — Z23 Encounter for immunization: Secondary | ICD-10-CM

## 2020-02-09 NOTE — Progress Notes (Signed)
   Covid-19 Vaccination Clinic  Name:  Rebecca Nunez    MRN: [id number] DOB: July 02, 1966  02/09/2020  [name] was observed post Covid-19 immunization for 15 minutes without incident. She was provided with Vaccine Information Sheet and instruction to access the V-Safe system.   [name] was instructed to call 911 with any severe reactions post vaccine: Marland Kitchen Difficulty breathing  . Swelling of face and throat  . A fast heartbeat  . A bad rash all over body  . Dizziness and weakness   Immunizations Administered    Name Date Dose VIS Date Route   Pfizer COVID-19 Vaccine 02/09/2020 12:36 PM 0.3 mL 11/02/2019 Intramuscular   Manufacturer: [place]   Lot: [id number]   [place]: [id number]

## 2020-02-11 ENCOUNTER — Encounter: Payer: Self-pay | Admitting: Obstetrics and Gynecology

## 2020-03-05 ENCOUNTER — Ambulatory Visit: Payer: 59 | Attending: Internal Medicine

## 2020-03-05 DIAGNOSIS — Z23 Encounter for immunization: Secondary | ICD-10-CM

## 2020-03-05 NOTE — Progress Notes (Signed)
   Covid-19 Vaccination Clinic  Name:  Latera Whillock    MRN: [id number] DOB: [birth year]  03/05/2020  Ms. Alto was observed post Covid-19 immunization for 15 minutes without incident. She was provided with Vaccine Information Sheet and instruction to access the V-Safe system.   Ms. Biviano was instructed to call 911 with any severe reactions post vaccine: Marland Kitchen Difficulty breathing  . Swelling of face and throat  . A fast heartbeat  . A bad rash all over body  . Dizziness and weakness   Immunizations Administered    Name Date Dose VIS Date Route   Pfizer COVID-19 Vaccine 03/05/2020 10:19 AM 0.3 mL 11/02/2019 Intramuscular   Manufacturer: [employer], [employer]   Lot: [id number]   [place]: [id number]

## 2020-03-24 ENCOUNTER — Other Ambulatory Visit: Payer: Self-pay | Admitting: Obstetrics and Gynecology

## 2020-06-20 ENCOUNTER — Telehealth: Payer: Self-pay | Admitting: Nurse Practitioner

## 2020-06-20 DIAGNOSIS — E7800 Pure hypercholesterolemia, unspecified: Secondary | ICD-10-CM

## 2020-06-20 DIAGNOSIS — E781 Pure hyperglyceridemia: Secondary | ICD-10-CM

## 2020-06-20 NOTE — Telephone Encounter (Signed)
Dr. Nehemiah Massed would like a calcium score order for the following patient  Diagnosis code=E78.2   Please order under Dr. Meda Coffee  Dr. Nehemiah Massed will call for results

## 2020-07-04 ENCOUNTER — Other Ambulatory Visit: Payer: Self-pay

## 2020-07-04 ENCOUNTER — Ambulatory Visit (INDEPENDENT_AMBULATORY_CARE_PROVIDER_SITE_OTHER)
Admission: RE | Admit: 2020-07-04 | Discharge: 2020-07-04 | Disposition: A | Discharge status: Home / Self Care | Payer: Self-pay | Source: Ambulatory Visit | Attending: Cardiology | Admitting: Cardiology

## 2020-07-04 DIAGNOSIS — E781 Pure hyperglyceridemia: Secondary | ICD-10-CM

## 2020-07-07 ENCOUNTER — Telehealth: Payer: Self-pay

## 2020-07-07 NOTE — Telephone Encounter (Signed)
LMTCB with questions regarding results-sent to mychart.

## 2020-07-07 NOTE — Telephone Encounter (Signed)
-----   Message from Sueanne Margarita, MD sent at 07/04/2020  5:15 PM EDT ----- Chest CT is normal with coronary Ca score of 0

## 2020-07-16 ENCOUNTER — Other Ambulatory Visit: Payer: Self-pay | Admitting: Obstetrics and Gynecology

## 2020-07-16 DIAGNOSIS — F5101 Primary insomnia: Secondary | ICD-10-CM

## 2020-07-17 ENCOUNTER — Other Ambulatory Visit: Payer: Self-pay | Admitting: Obstetrics and Gynecology

## 2020-07-17 ENCOUNTER — Encounter: Payer: Self-pay | Admitting: Obstetrics and Gynecology

## 2020-07-17 DIAGNOSIS — F5101 Primary insomnia: Secondary | ICD-10-CM

## 2020-07-17 MED ORDER — ZOLPIDEM TARTRATE 10 MG PO TABS: ORAL_TABLET | ORAL | 3 refills | Status: DC

## 2020-07-17 NOTE — Progress Notes (Signed)
Rx RF ambien.

## 2020-08-08 ENCOUNTER — Encounter: Payer: Self-pay | Admitting: Obstetrics and Gynecology

## 2021-01-19 NOTE — Patient Instructions (Addendum)
I value your feedback and you entrusting us with your care. If you get a [facility] patient survey, I would appreciate you taking the time to let us know about your experience today. Thank you!  [facility]: [phone]      

## 2021-01-19 NOTE — Progress Notes (Signed)
PCP: Chad Cordial, PA-C   Chief Complaint  Patient presents with  . Gynecologic Exam    No concerns    HPI:      [name] is a 55 y.o. [id number] who LMP was Patient's last menstrual period was 08/05/2017., presents today for her annual examination.  Her menses are absent due to menopause. She does not have PMB. LMP 9/18.  She does have vasomotor sx that are improved with prometrium 150 mg dose (alternates 1-2 caps). Started 11/20.  Sex activity: single partner. She does not have vaginal dryness.  Last Pap: 01/09/19  Results were: no abnormalities /neg HPV DNA.  Hx of STDs: none  Last mammogram: 02/01/20  Results were: normal--routine follow-up in 12 months There is no FH of breast cancer. There is no FH of ovarian cancer. The patient does not do self-breast exams.  Colonoscopy: neg Cologuard 2/20, repeat after 3 yrs  Tobacco use: The patient denies current or previous tobacco use. Alcohol use: none  No drug use Exercise: moderately active  She does get adequate calcium and Vitamin D in her diet.  She takes ambien 5 mg about 5 nights a week for insomnia and needs RF currently.  Normal lipids/labs 2/21. FH MI in her dad at age 63. Had neg cardio eval with Dr. Nehemiah Massed, taking ASA 81 mg daily. Taking accutane for skin issues. Needs CMP.  Would like varicella IgG because she doesn't know if she had chicken pox. Wants to get shingles vaccine if needed.    Past Medical History:  Diagnosis Date  . Family history of heart disease in female family member before age 46   . History of mammogram [id number]; 11/18/16   birads 1; neg  . History of Papanicolaou smear of cervix 08/31/12; 11/22/15   -/-; -/-  . Insomnia   . Macular degeneration   . Menstrual migraine   . Screening for colon cancer 12/2018   neg Cologuard, repeat in 3 yrs    Past Surgical History:  Procedure Laterality Date  . OPEN REDUCTION INTERNAL FIXATION (ORIF) TIBIA/FIBULA FRACTURE  1999     Family History  Problem Relation Age of Onset  . Alzheimer's disease Mother        doesn't carry genetic gene  . Hyperlipidemia Mother   . Hypertension Mother   . Heart disease Father 58       MI  . CVA Maternal Grandfather   . CVA Paternal Grandfather   . Breast cancer Neg Hx     Social History   Socioeconomic History  . Marital status: Married    Spouse name: Not on file  . Number of children: 2  . Years of education: 75  . Highest education level: Not on file  Occupational History  . Occupation: OFFICE MANAGER  Tobacco Use  . Smoking status: Never Smoker  . Smokeless tobacco: Never Used  Vaping Use  . Vaping Use: Never used  Substance and Sexual Activity  . Alcohol use: No  . Drug use: No  . Sexual activity: Yes    Birth control/protection: Post-menopausal  Other Topics Concern  . Not on file  Social History Narrative  . Not on file   Social Determinants of Health   Financial Resource Strain: Not on file  Food Insecurity: Not on file  Transportation Needs: Not on file  Physical Activity: Not on file  Stress: Not on file  Social Connections: Not on file  Intimate Partner Violence: Not on file  Current Meds  Medication Sig  . calcium carbonate (OS-CAL) 1250 (500 Ca) MG chewable tablet Chew by mouth.  . hydrocortisone 2.5 % ointment Apply 1 application topically 2 (two) times daily.  . ISOtretinoin (ACCUTANE) 40 MG capsule   . meloxicam (MOBIC) 15 MG tablet meloxicam 15 mg tablet  TAKE 1 TABLET BY MOUTH EVERY DAY  . [DISCONTINUED] progesterone (PROMETRIUM) 100 MG capsule progesterone micronized 100 mg capsule  . [DISCONTINUED] zolpidem (AMBIEN) 10 MG tablet Take 1/2 tab at bedtime prn sx  . progesterone (PROMETRIUM) 100 MG capsule Take 1-2 caps nightly, 6 nights on, 1 night off      ROS:  Review of Systems  Constitutional: Negative for fatigue, fever and unexpected weight change.  Respiratory: Negative for cough, shortness of breath and  wheezing.   Cardiovascular: Negative for chest pain, palpitations and leg swelling.  Gastrointestinal: Negative for blood in stool, constipation, diarrhea, nausea and vomiting.  Endocrine: Negative for cold intolerance, heat intolerance and polyuria.  Genitourinary: Negative for dyspareunia, dysuria, flank pain, frequency, genital sores, hematuria, menstrual problem, pelvic pain, urgency, vaginal bleeding, vaginal discharge and vaginal pain.  Musculoskeletal: Negative for arthralgias, back pain, joint swelling and myalgias.  Skin: Negative for rash.  Neurological: Negative for dizziness, syncope, light-headedness, numbness and headaches.  Hematological: Negative for adenopathy.  Psychiatric/Behavioral: Negative for agitation, confusion, sleep disturbance and suicidal ideas. The patient is not nervous/anxious.      Objective: BP 110/70   Ht 5\' 3"  (1.6 m)   Wt 142 lb (64.4 kg)   LMP 08/05/2017   BMI 25.15 kg/m    Physical Exam Constitutional:      Appearance: She is well-developed.  Genitourinary:     Vulva normal.     Right Labia: No rash, tenderness or lesions.    Left Labia: No tenderness, lesions or rash.    No vaginal discharge, erythema or tenderness.      Right Adnexa: not tender and no mass present.    Left Adnexa: not tender and no mass present.    No cervical motion tenderness, friability or polyp.     Uterus is not enlarged or tender.  Breasts:     Right: No mass, nipple discharge, skin change or tenderness.     Left: No mass, nipple discharge, skin change or tenderness.    Neck:     Thyroid: No thyromegaly.  Cardiovascular:     Rate and Rhythm: Normal rate and regular rhythm.     Heart sounds: Normal heart sounds. No murmur heard.   Pulmonary:     Effort: Pulmonary effort is normal.     Breath sounds: Normal breath sounds.  Abdominal:     Palpations: Abdomen is soft.     Tenderness: There is no abdominal tenderness. There is no guarding or rebound.   Musculoskeletal:        General: Normal range of motion.     Cervical back: Normal range of motion.  Lymphadenopathy:     Cervical: No cervical adenopathy.  Neurological:     General: No focal deficit present.     Mental Status: She is alert and oriented to person, place, and time.     Cranial Nerves: No cranial nerve deficit.  Skin:    General: Skin is warm and dry.  Psychiatric:        Mood and Affect: Mood normal.        Behavior: Behavior normal.        Thought Content: Thought content normal.  Judgment: Judgment normal.  Vitals reviewed.     Assessment/Plan:  Encounter for annual routine gynecological examination  Encounter for screening mammogram for malignant neoplasm of breast - Plan: MM 3D SCREEN BREAST BILATERAL; pt to sched mammo  Hormone replacement therapy (HRT) - Plan: progesterone (PROMETRIUM) 100 MG capsule  Vasomotor symptoms due to menopause - Plan: progesterone (PROMETRIUM) 100 MG capsule; Rx RF. F/u prn.   Primary insomnia - Plan: zolpidem (AMBIEN) 10 MG tablet; Rx RF.   No history of varicella vaccination - Plan: Varicella zoster antibody, IgG  On Accutane therapy - Plan: Comprehensive metabolic panel           Meds ordered this encounter  Medications  . zolpidem (AMBIEN) 10 MG tablet    Sig: Take 1/2 tab at bedtime prn sx    Dispense:  30 tablet    Refill:  5    Order Specific Question:   Supervising Provider    Answer:   Gae Dry [id number]  . progesterone (PROMETRIUM) 100 MG capsule    Sig: Take 1-2 caps nightly, 6 nights on, 1 night off    Dispense:  90 capsule    Refill:  2    Order Specific Question:   Supervising Provider    Answer:   Gae Dry [[id number]]    GYN counsel breast self exam, mammography screening, menopause, adequate intake of calcium and vitamin D, diet and exercise    F/U  Return in about 1 year (around 01/20/2022).  Cresencio Reesor B. Sindee Stucker, PA-C 01/20/2021 8:53 AM

## 2021-01-20 ENCOUNTER — Ambulatory Visit (INDEPENDENT_AMBULATORY_CARE_PROVIDER_SITE_OTHER): Payer: 59 | Admitting: Obstetrics and Gynecology

## 2021-01-20 ENCOUNTER — Other Ambulatory Visit: Payer: Self-pay

## 2021-01-20 ENCOUNTER — Encounter: Payer: Self-pay | Admitting: Obstetrics and Gynecology

## 2021-01-20 VITALS — BP 110/70 | Ht 63.0 in | Wt 142.0 lb

## 2021-01-20 DIAGNOSIS — Z1231 Encounter for screening mammogram for malignant neoplasm of breast: Secondary | ICD-10-CM

## 2021-01-20 DIAGNOSIS — Z01419 Encounter for gynecological examination (general) (routine) without abnormal findings: Secondary | ICD-10-CM | POA: Diagnosis not present

## 2021-01-20 DIAGNOSIS — Z79890 Hormone replacement therapy (postmenopausal): Secondary | ICD-10-CM

## 2021-01-20 DIAGNOSIS — Z789 Other specified health status: Secondary | ICD-10-CM

## 2021-01-20 DIAGNOSIS — N951 Menopausal and female climacteric states: Secondary | ICD-10-CM | POA: Diagnosis not present

## 2021-01-20 DIAGNOSIS — Z79899 Other long term (current) drug therapy: Secondary | ICD-10-CM

## 2021-01-20 DIAGNOSIS — F5101 Primary insomnia: Secondary | ICD-10-CM

## 2021-01-20 DIAGNOSIS — Z792 Long term (current) use of antibiotics: Secondary | ICD-10-CM

## 2021-01-20 MED ORDER — ZOLPIDEM TARTRATE 10 MG PO TABS: ORAL_TABLET | ORAL | 5 refills | Status: DC

## 2021-01-20 MED ORDER — PROGESTERONE MICRONIZED 100 MG PO CAPS: ORAL_CAPSULE | ORAL | 2 refills | Status: DC

## 2021-01-21 LAB — COMPREHENSIVE METABOLIC PANEL
ALT: 23 IU/L (ref 0–32)
AST: 34 IU/L (ref 0–40)
Albumin/Globulin Ratio: 2 (ref 1.2–2.2)
Albumin: 4.9 g/dL (ref 3.8–4.9)
Alkaline Phosphatase: 70 IU/L (ref 44–121)
BUN/Creatinine Ratio: 21 (ref 9–23)
BUN: 19 mg/dL (ref 6–24)
Bilirubin Total: 0.3 mg/dL (ref 0.0–1.2)
CO2: 21 mmol/L (ref 20–29)
Calcium: 9.9 mg/dL (ref 8.7–10.2)
Chloride: 103 mmol/L (ref 96–106)
Creatinine, Ser: 0.92 mg/dL (ref 0.57–1.00)
Globulin, Total: 2.5 g/dL (ref 1.5–4.5)
Glucose: 65 mg/dL (ref 65–99)
Potassium: 4.2 mmol/L (ref 3.5–5.2)
Sodium: 143 mmol/L (ref 134–144)
Total Protein: 7.4 g/dL (ref 6.0–8.5)
eGFR: 74 mL/min/{1.73_m2} (ref >59)

## 2021-01-21 LAB — VARICELLA ZOSTER ANTIBODY, IGG: Varicella zoster IgG: 887 index (ref >165)

## 2021-02-03 ENCOUNTER — Encounter: Payer: Self-pay | Admitting: Obstetrics and Gynecology

## 2021-02-03 NOTE — Telephone Encounter (Signed)
Pls send labs from 3/22 to pt's MD. Thx.

## 2021-02-24 ENCOUNTER — Ambulatory Visit
Admission: RE | Admit: 2021-02-24 | Discharge: 2021-02-24 | Disposition: A | Discharge status: Home / Self Care | Payer: 59 | Source: Ambulatory Visit | Attending: Obstetrics and Gynecology | Admitting: Obstetrics and Gynecology

## 2021-02-24 ENCOUNTER — Other Ambulatory Visit: Payer: Self-pay

## 2021-02-24 DIAGNOSIS — Z1231 Encounter for screening mammogram for malignant neoplasm of breast: Secondary | ICD-10-CM | POA: Insufficient documentation

## 2021-07-13 ENCOUNTER — Encounter: Payer: Self-pay | Admitting: Obstetrics and Gynecology

## 2021-07-13 ENCOUNTER — Other Ambulatory Visit: Payer: Self-pay | Admitting: Obstetrics and Gynecology

## 2021-07-13 DIAGNOSIS — N951 Menopausal and female climacteric states: Secondary | ICD-10-CM

## 2021-07-13 DIAGNOSIS — Z79890 Hormone replacement therapy (postmenopausal): Secondary | ICD-10-CM

## 2021-07-20 ENCOUNTER — Other Ambulatory Visit: Payer: Self-pay | Admitting: Obstetrics and Gynecology

## 2021-07-20 DIAGNOSIS — F5101 Primary insomnia: Secondary | ICD-10-CM

## 2022-01-27 NOTE — Progress Notes (Signed)
? ?PCP: Minela Bridgewater, Deirdre Evener, PA-C ? ? ?Chief Complaint  ?Patient presents with  ? Gynecologic Exam  ?  No concerns  ? ? ?HPI: ?     [name] is a 56 y.o. [id number] who LMP was Patient's last menstrual period was 08/05/2017., presents today for her annual examination.  Her menses are absent due to menopause. She does not have PMB. LMP 9/18.  She does have vasomotor sx that are improved with prometrium 150 mg dose (alternates 1-2 caps). Started 11/20. Would like to continue.  ? ?Sex activity: single partner. She does not have vaginal dryness. No pain/bleeding.  ? ?Last Pap: 01/09/19  Results were: no abnormalities /neg HPV DNA.  ?Hx of STDs: none ? ?Last mammogram: 02/24/21  Results were: normal--routine follow-up in 12 months ?There is no FH of breast cancer. There is no FH of ovarian cancer. The patient does not do self-breast exams. ? ?Colonoscopy: neg Cologuard 2/20, repeat after 3 yrs. Would like to do it again. No rectal bleeding/blood in stool.  ? ?Tobacco use: The patient denies current or previous tobacco use. ?Alcohol use: none  ?No drug use ?Exercise: moderately active ? ?She does get adequate calcium and Vitamin D in her diet. ? ?She takes ambien 5 mg nightly for insomnia and needs RF currently.  ?Normal lipids/labs 2/21. FH MI in her dad at age 31. Had neg cardio eval with Dr. Nehemiah Massed, taking ASA 81 mg daily. Due for labs today.  ? ?Pt has anxiety with flying, may want to try Rx valium before next trip. Will call for Rx prn.  ? ?Past Medical History:  ?Diagnosis Date  ? Family history of heart disease in female family member before age 25   ? History of mammogram [id number]; 11/18/16  ? birads 1; neg  ? History of Papanicolaou smear of cervix 08/31/12; 11/22/15  ? -/-; -/-  ? Insomnia   ? Macular degeneration   ? Menstrual migraine   ? Screening for colon cancer 12/2018  ? neg Cologuard, repeat in 3 yrs  ? ? ?Past Surgical History:  ?Procedure Laterality Date  ? OPEN REDUCTION INTERNAL FIXATION (ORIF)  TIBIA/FIBULA FRACTURE  1999  ? ? ?Family History  ?Problem Relation Age of Onset  ? Alzheimer's disease Mother   ?     doesn't carry genetic gene  ? Hyperlipidemia Mother   ? Hypertension Mother   ? Heart disease Father 64  ?     MI  ? CVA Maternal Grandfather   ? CVA Paternal Grandfather   ? Breast cancer Neg Hx   ? ? ?Social History  ? ?Socioeconomic History  ? Marital status: Married  ?  Spouse name: Not on file  ? Number of children: 2  ? Years of education: 11  ? Highest education level: Not on file  ?Occupational History  ? Occupation: OFFICE MANAGER  ?Tobacco Use  ? Smoking status: Never  ? Smokeless tobacco: Never  ?Vaping Use  ? Vaping Use: Never used  ?Substance and Sexual Activity  ? Alcohol use: No  ? Drug use: No  ? Sexual activity: Yes  ?  Birth control/protection: Post-menopausal  ?Other Topics Concern  ? Not on file  ?Social History Narrative  ? Not on file  ? ?Social Determinants of Health  ? ?Financial Resource Strain: Not on file  ?Food Insecurity: Not on file  ?Transportation Needs: Not on file  ?Physical Activity: Not on file  ?Stress: Not on file  ?Social Connections:  Not on file  ?Intimate Partner Violence: Not on file  ? ? ?Current Meds  ?Medication Sig  ? calcium carbonate (OS-CAL) 1250 (500 Ca) MG chewable tablet Chew by mouth.  ? meloxicam (MOBIC) 15 MG tablet meloxicam 15 mg tablet ? TAKE 1 TABLET BY MOUTH EVERY DAY  ? [DISCONTINUED] progesterone (PROMETRIUM) 100 MG capsule Take 1-2 caps nightly, 6 nights on, 1 night off  ? [DISCONTINUED] zolpidem (AMBIEN) 10 MG tablet TAKE 1/2 TAB AT BEDTIME AS NEEDED  ? ? ? ? ?ROS: ? ?Review of Systems  ?Constitutional:  Negative for fatigue, fever and unexpected weight change.  ?Respiratory:  Negative for cough, shortness of breath and wheezing.   ?Cardiovascular:  Negative for chest pain, palpitations and leg swelling.  ?Gastrointestinal:  Negative for blood in stool, constipation, diarrhea, nausea and vomiting.  ?Endocrine: Negative for cold  intolerance, heat intolerance and polyuria.  ?Genitourinary:  Negative for dyspareunia, dysuria, flank pain, frequency, genital sores, hematuria, menstrual problem, pelvic pain, urgency, vaginal bleeding, vaginal discharge and vaginal pain.  ?Musculoskeletal:  Negative for arthralgias, back pain, joint swelling and myalgias.  ?Skin:  Negative for rash.  ?Neurological:  Negative for dizziness, syncope, light-headedness, numbness and headaches.  ?Hematological:  Negative for adenopathy.  ?Psychiatric/Behavioral:  Negative for agitation, confusion, sleep disturbance and suicidal ideas. The patient is not nervous/anxious.   ? ? ?Objective: ?BP 110/70   Ht '5\' 3"'$  (1.6 m)   Wt 145 lb (65.8 kg)   LMP 08/05/2017   BMI 25.69 kg/m?  ? ? ?Physical Exam ?Constitutional:   ?   Appearance: She is well-developed.  ?Genitourinary:  ?   Vulva normal.  ?   Right Labia: No rash, tenderness or lesions. ?   Left Labia: No tenderness, lesions or rash. ?   No vaginal discharge, erythema or tenderness.  ? ?   Right Adnexa: not tender and no mass present. ?   Left Adnexa: not tender and no mass present. ?   No cervical motion tenderness, friability or polyp.  ?   Uterus is not enlarged or tender.  ?Breasts: ?   Right: No mass, nipple discharge, skin change or tenderness.  ?   Left: No mass, nipple discharge, skin change or tenderness.  ?Neck:  ?   Thyroid: No thyromegaly.  ?Cardiovascular:  ?   Rate and Rhythm: Normal rate and regular rhythm.  ?   Heart sounds: Normal heart sounds. No murmur heard. ?Pulmonary:  ?   Effort: Pulmonary effort is normal.  ?   Breath sounds: Normal breath sounds.  ?Abdominal:  ?   Palpations: Abdomen is soft.  ?   Tenderness: There is no abdominal tenderness. There is no guarding or rebound.  ?Musculoskeletal:     ?   General: Normal range of motion.  ?   Cervical back: Normal range of motion.  ?Lymphadenopathy:  ?   Cervical: No cervical adenopathy.  ?Neurological:  ?   General: No focal deficit present.  ?    Mental Status: She is alert and oriented to person, place, and time.  ?   Cranial Nerves: No cranial nerve deficit.  ?Skin: ?   General: Skin is warm and dry.  ?Psychiatric:     ?   Mood and Affect: Mood normal.     ?   Behavior: Behavior normal.     ?   Thought Content: Thought content normal.     ?   Judgment: Judgment normal.  ?Vitals reviewed.  ? ? ?Assessment/Plan: ?Encounter  for annual routine gynecological examination ? ?Cervical cancer screening - Plan: Cytology - PAP ? ?Screening for HPV (human papillomavirus) - Plan: Cytology - PAP ? ?Encounter for screening mammogram for malignant neoplasm of breast - Plan: MM 3D SCREEN BREAST BILATERAL; pt to schedule mammo ? ?Screening for colon cancer - Plan: Cologuard; colonoscopy/cologuard discussed. Pt elects cologuard. Ref sent. Will f/u with results. ? ?Hormone replacement therapy (HRT) - Plan: progesterone (PROMETRIUM) 100 MG capsule; doing well. Rx RF.  ? ?Vasomotor symptoms due to menopause - Plan: progesterone (PROMETRIUM) 100 MG capsule ? ?Primary insomnia - Plan: zolpidem (AMBIEN) 10 MG tablet; taking QHS now, Rx RF.  ? ?Elevated LDL cholesterol level - Plan: Lipid panel; recheck labs today.  ? ?Blood tests for routine general physical examination - Plan: Comprehensive metabolic panel, Lipid panel ? ?        ?Meds ordered this encounter  ?Medications  ? zolpidem (AMBIEN) 10 MG tablet  ?  Sig: TAKE 1/2 TAB AT BEDTIME AS NEEDED  ?  Dispense:  15 tablet  ?  Refill:  5  ?  This request is for a new prescription for a controlled substance as required by Federal/State law. DX Code Needed  NEED NEW RX, EXPIRED.  ?  Order Specific Question:   Supervising Provider  ?  AnswerGae Dry [[id number]]  ? progesterone (PROMETRIUM) 100 MG capsule  ?  Sig: Take 1-2 caps nightly, 6 nights on, 1 night off  ?  Dispense:  90 capsule  ?  Refill:  3  ?  Order Specific Question:   Supervising Provider  ?  AnswerGae Dry [[id number]]  ? ? ?GYN counsel breast self  exam, mammography screening, menopause, adequate intake of calcium and vitamin D, diet and exercise ? ?  F/U ? Return in about 1 year (around 01/29/2023). ? ?Miho Monda B. Odeth Bry, PA-C ?01/28/2022 ?9:36 AM ?

## 2022-01-28 ENCOUNTER — Other Ambulatory Visit (HOSPITAL_COMMUNITY)
Admission: RE | Admit: 2022-01-28 | Discharge: 2022-01-28 | Disposition: A | Discharge status: Home / Self Care | Payer: 59 | Source: Ambulatory Visit | Attending: Obstetrics and Gynecology | Admitting: Obstetrics and Gynecology

## 2022-01-28 ENCOUNTER — Other Ambulatory Visit: Payer: Self-pay

## 2022-01-28 ENCOUNTER — Encounter: Payer: Self-pay | Admitting: Obstetrics and Gynecology

## 2022-01-28 ENCOUNTER — Ambulatory Visit (INDEPENDENT_AMBULATORY_CARE_PROVIDER_SITE_OTHER): Payer: 59 | Admitting: Obstetrics and Gynecology

## 2022-01-28 VITALS — BP 110/70 | Ht 63.0 in | Wt 145.0 lb

## 2022-01-28 DIAGNOSIS — Z79890 Hormone replacement therapy (postmenopausal): Secondary | ICD-10-CM

## 2022-01-28 DIAGNOSIS — Z01419 Encounter for gynecological examination (general) (routine) without abnormal findings: Secondary | ICD-10-CM | POA: Diagnosis not present

## 2022-01-28 DIAGNOSIS — Z1151 Encounter for screening for human papillomavirus (HPV): Secondary | ICD-10-CM

## 2022-01-28 DIAGNOSIS — N951 Menopausal and female climacteric states: Secondary | ICD-10-CM

## 2022-01-28 DIAGNOSIS — Z0000 Encounter for general adult medical examination without abnormal findings: Secondary | ICD-10-CM

## 2022-01-28 DIAGNOSIS — Z124 Encounter for screening for malignant neoplasm of cervix: Secondary | ICD-10-CM | POA: Diagnosis present

## 2022-01-28 DIAGNOSIS — E7800 Pure hypercholesterolemia, unspecified: Secondary | ICD-10-CM

## 2022-01-28 DIAGNOSIS — Z1231 Encounter for screening mammogram for malignant neoplasm of breast: Secondary | ICD-10-CM | POA: Diagnosis not present

## 2022-01-28 DIAGNOSIS — Z1211 Encounter for screening for malignant neoplasm of colon: Secondary | ICD-10-CM

## 2022-01-28 DIAGNOSIS — F5101 Primary insomnia: Secondary | ICD-10-CM

## 2022-01-28 MED ORDER — PROGESTERONE MICRONIZED 100 MG PO CAPS: ORAL_CAPSULE | ORAL | 3 refills | Status: DC

## 2022-01-28 MED ORDER — ZOLPIDEM TARTRATE 10 MG PO TABS: ORAL_TABLET | ORAL | 5 refills | Status: DC

## 2022-01-28 NOTE — Patient Instructions (Signed)
I value your feedback and you entrusting us with your care. If you get a [place] patient survey, I would appreciate you taking the time to let us know about your experience today. Thank you!  [facility]: [phone]      

## 2022-01-29 LAB — LIPID PANEL
Chol/HDL Ratio: 3.8 ratio (ref 0.0–4.4)
Cholesterol, Total: 181 mg/dL (ref 100–199)
HDL: 48 mg/dL (ref >39)
LDL Chol Calc (NIH): 118 mg/dL — ABNORMAL HIGH (ref 0–99)
Triglycerides: 78 mg/dL (ref 0–149)
VLDL Cholesterol Cal: 15 mg/dL (ref 5–40)

## 2022-01-29 LAB — COMPREHENSIVE METABOLIC PANEL
ALT: 16 IU/L (ref 0–32)
AST: 20 IU/L (ref 0–40)
Albumin/Globulin Ratio: 2.4 — ABNORMAL HIGH (ref 1.2–2.2)
Albumin: 4.7 g/dL (ref 3.8–4.9)
Alkaline Phosphatase: 57 IU/L (ref 44–121)
BUN/Creatinine Ratio: 22 (ref 9–23)
BUN: 19 mg/dL (ref 6–24)
Bilirubin Total: 0.4 mg/dL (ref 0.0–1.2)
CO2: 22 mmol/L (ref 20–29)
Calcium: 9.7 mg/dL (ref 8.7–10.2)
Chloride: 110 mmol/L — ABNORMAL HIGH (ref 96–106)
Creatinine, Ser: 0.86 mg/dL (ref 0.57–1.00)
Globulin, Total: 2 g/dL (ref 1.5–4.5)
Glucose: 77 mg/dL (ref 70–99)
Potassium: 4 mmol/L (ref 3.5–5.2)
Sodium: 147 mmol/L — ABNORMAL HIGH (ref 134–144)
Total Protein: 6.7 g/dL (ref 6.0–8.5)
eGFR: 80 mL/min/{1.73_m2} (ref >59)

## 2022-01-29 LAB — CYTOLOGY - PAP
Adequacy: ABSENT
Comment: NEGATIVE
Diagnosis: NEGATIVE
High risk HPV: NEGATIVE

## 2022-02-11 LAB — COLOGUARD: COLOGUARD: NEGATIVE

## 2022-03-15 ENCOUNTER — Ambulatory Visit
Admission: RE | Admit: 2022-03-15 | Discharge: 2022-03-15 | Disposition: A | Discharge status: Home / Self Care | Payer: 59 | Source: Ambulatory Visit | Attending: Obstetrics and Gynecology | Admitting: Obstetrics and Gynecology

## 2022-03-15 DIAGNOSIS — Z1231 Encounter for screening mammogram for malignant neoplasm of breast: Secondary | ICD-10-CM | POA: Diagnosis not present

## 2022-08-10 ENCOUNTER — Other Ambulatory Visit: Payer: Self-pay | Admitting: Obstetrics and Gynecology

## 2022-08-10 DIAGNOSIS — F5101 Primary insomnia: Secondary | ICD-10-CM

## 2022-10-09 ENCOUNTER — Other Ambulatory Visit: Payer: Self-pay | Admitting: Obstetrics and Gynecology

## 2022-10-09 DIAGNOSIS — N951 Menopausal and female climacteric states: Secondary | ICD-10-CM

## 2022-10-09 DIAGNOSIS — Z79890 Hormone replacement therapy (postmenopausal): Secondary | ICD-10-CM

## 2022-11-21 ENCOUNTER — Other Ambulatory Visit: Payer: Self-pay | Admitting: Obstetrics and Gynecology

## 2022-11-21 DIAGNOSIS — N951 Menopausal and female climacteric states: Secondary | ICD-10-CM

## 2022-11-21 DIAGNOSIS — Z79890 Hormone replacement therapy (postmenopausal): Secondary | ICD-10-CM

## 2022-11-23 ENCOUNTER — Other Ambulatory Visit: Payer: Self-pay | Admitting: Obstetrics and Gynecology

## 2022-11-23 DIAGNOSIS — Z79890 Hormone replacement therapy (postmenopausal): Secondary | ICD-10-CM

## 2022-11-23 DIAGNOSIS — N951 Menopausal and female climacteric states: Secondary | ICD-10-CM

## 2022-11-24 ENCOUNTER — Encounter: Payer: Self-pay | Admitting: Obstetrics and Gynecology

## 2022-11-24 ENCOUNTER — Other Ambulatory Visit: Payer: Self-pay | Admitting: Obstetrics and Gynecology

## 2022-11-24 DIAGNOSIS — N951 Menopausal and female climacteric states: Secondary | ICD-10-CM

## 2022-11-24 DIAGNOSIS — Z79890 Hormone replacement therapy (postmenopausal): Secondary | ICD-10-CM

## 2022-11-25 ENCOUNTER — Other Ambulatory Visit: Payer: Self-pay | Admitting: Obstetrics and Gynecology

## 2022-11-25 DIAGNOSIS — N951 Menopausal and female climacteric states: Secondary | ICD-10-CM

## 2022-11-25 DIAGNOSIS — Z79890 Hormone replacement therapy (postmenopausal): Secondary | ICD-10-CM

## 2022-11-25 MED ORDER — ALPRAZOLAM 0.5 MG PO TABS: 0.5000 mg | ORAL_TABLET | Freq: Two times a day (BID) | ORAL | 0 refills | Status: AC | PRN

## 2022-11-25 MED ORDER — PROGESTERONE MICRONIZED 100 MG PO CAPS: ORAL_CAPSULE | ORAL | 0 refills | Status: DC

## 2022-11-25 NOTE — Progress Notes (Signed)
Rx xanax for flying.

## 2022-11-25 NOTE — Progress Notes (Signed)
Rx RF prometrium till 3/24 annual

## 2023-02-09 NOTE — Progress Notes (Unsigned)
PCP: Chad Cordial, PA-C   No chief complaint on file.   HPI:      [name] is a 57 y.o. [id number] who LMP was Patient's last menstrual period was 08/05/2017., presents today for her annual examination.  Her menses are absent due to menopause. She does not have PMB. LMP 9/18.  She does have vasomotor sx that are improved with prometrium 150 mg dose (alternates 1-2 caps). Started 11/20. Would like to continue.   Sex activity: single partner. She does not have vaginal dryness. No pain/bleeding.   Last Pap: 01/28/22  Results were: no abnormalities /neg HPV DNA.  Hx of STDs: none  Last mammogram: 03/15/22 Results were: normal--routine follow-up in 12 months There is no FH of breast cancer. There is no FH of ovarian cancer. The patient does not do self-breast exams.  Colonoscopy: neg Cologuard 2/20 and 3/23;  repeat after 3 yrs. Would like to do it again.   Tobacco use: The patient denies current or previous tobacco use. Alcohol use: none  No drug use Exercise: moderately active  She does get adequate calcium and Vitamin D in her diet.  She takes ambien 5 mg nightly for insomnia and needs RF currently.  Normal lipids/labs 2/21. FH MI in her dad at age 44. Had neg cardio eval with Dr. Nehemiah Massed, taking ASA 81 mg daily. Due for labs today.   Pt has anxiety with flying, may want to try Rx valium before next trip. Will call for Rx prn.   Past Medical History:  Diagnosis Date   Family history of heart disease in female family member before age 67    History of mammogram [id number]; 11/18/16   birads 1; neg   History of Papanicolaou smear of cervix 08/31/12; 11/22/15   -/-; -/-   Insomnia    Macular degeneration    Menstrual migraine    Screening for colon cancer 12/2018   neg Cologuard, repeat in 3 yrs    Past Surgical History:  Procedure Laterality Date   OPEN REDUCTION INTERNAL FIXATION (ORIF) TIBIA/FIBULA FRACTURE  1999    Family History  Problem Relation Age of  Onset   Alzheimer's disease Mother        doesn't carry genetic gene   Hyperlipidemia Mother    Hypertension Mother    Heart disease Father 18       MI   CVA Maternal Grandfather    CVA Paternal Grandfather    Breast cancer Neg Hx     Social History   Socioeconomic History   Marital status: Married    Spouse name: Not on file   Number of children: 2   Years of education: 14   Highest education level: Not on file  Occupational History   Occupation: OFFICE MANAGER  Tobacco Use   Smoking status: Never   Smokeless tobacco: Never  Vaping Use   Vaping Use: Never used  Substance and Sexual Activity   Alcohol use: No   Drug use: No   Sexual activity: Yes    Birth control/protection: Post-menopausal  Other Topics Concern   Not on file  Social History Narrative   Not on file   Social Determinants of Health   Financial Resource Strain: Not on file  Food Insecurity: Not on file  Transportation Needs: Not on file  Physical Activity: Not on file  Stress: Not on file  Social Connections: Not on file  Intimate Partner Violence: Not on file    No  outpatient medications have been marked as taking for the 02/10/23 encounter (Appointment) with Jr Milliron, Deirdre Evener, PA-C.      ROS:  Review of Systems  Constitutional:  Negative for fatigue, fever and unexpected weight change.  Respiratory:  Negative for cough, shortness of breath and wheezing.   Cardiovascular:  Negative for chest pain, palpitations and leg swelling.  Gastrointestinal:  Negative for blood in stool, constipation, diarrhea, nausea and vomiting.  Endocrine: Negative for cold intolerance, heat intolerance and polyuria.  Genitourinary:  Negative for dyspareunia, dysuria, flank pain, frequency, genital sores, hematuria, menstrual problem, pelvic pain, urgency, vaginal bleeding, vaginal discharge and vaginal pain.  Musculoskeletal:  Negative for arthralgias, back pain, joint swelling and myalgias.  Skin:  Negative for  rash.  Neurological:  Negative for dizziness, syncope, light-headedness, numbness and headaches.  Hematological:  Negative for adenopathy.  Psychiatric/Behavioral:  Negative for agitation, confusion, sleep disturbance and suicidal ideas. The patient is not nervous/anxious.      Objective: LMP 08/05/2017    Physical Exam Constitutional:      Appearance: She is well-developed.  Genitourinary:     Vulva normal.     Right Labia: No rash, tenderness or lesions.    Left Labia: No tenderness, lesions or rash.    No vaginal discharge, erythema or tenderness.      Right Adnexa: not tender and no mass present.    Left Adnexa: not tender and no mass present.    No cervical motion tenderness, friability or polyp.     Uterus is not enlarged or tender.  Breasts:    Right: No mass, nipple discharge, skin change or tenderness.     Left: No mass, nipple discharge, skin change or tenderness.  Neck:     Thyroid: No thyromegaly.  Cardiovascular:     Rate and Rhythm: Normal rate and regular rhythm.     Heart sounds: Normal heart sounds. No murmur heard. Pulmonary:     Effort: Pulmonary effort is normal.     Breath sounds: Normal breath sounds.  Abdominal:     Palpations: Abdomen is soft.     Tenderness: There is no abdominal tenderness. There is no guarding or rebound.  Musculoskeletal:        General: Normal range of motion.     Cervical back: Normal range of motion.  Lymphadenopathy:     Cervical: No cervical adenopathy.  Neurological:     General: No focal deficit present.     Mental Status: She is alert and oriented to person, place, and time.     Cranial Nerves: No cranial nerve deficit.  Skin:    General: Skin is warm and dry.  Psychiatric:        Mood and Affect: Mood normal.        Behavior: Behavior normal.        Thought Content: Thought content normal.        Judgment: Judgment normal.  Vitals reviewed.     Assessment/Plan: Encounter for annual routine gynecological  examination  Cervical cancer screening - Plan: Cytology - PAP  Screening for HPV (human papillomavirus) - Plan: Cytology - PAP  Encounter for screening mammogram for malignant neoplasm of breast - Plan: MM 3D SCREEN BREAST BILATERAL; pt to schedule mammo  Screening for colon cancer - Plan: Cologuard; colonoscopy/cologuard discussed. Pt elects cologuard. Ref sent. Will f/u with results.  Hormone replacement therapy (HRT) - Plan: progesterone (PROMETRIUM) 100 MG capsule; doing well. Rx RF.   Vasomotor symptoms due to menopause -  Plan: progesterone (PROMETRIUM) 100 MG capsule  Primary insomnia - Plan: zolpidem (AMBIEN) 10 MG tablet; taking QHS now, Rx RF.   Elevated LDL cholesterol level - Plan: Lipid panel; recheck labs today.   Blood tests for routine general physical examination - Plan: Comprehensive metabolic panel, Lipid panel          No orders of the defined types were placed in this encounter.   GYN counsel breast self exam, mammography screening, menopause, adequate intake of calcium and vitamin D, diet and exercise    F/U  No follow-ups on file.  Prisilla Kocsis B. Saima Monterroso, PA-C 02/09/2023 5:19 PM

## 2023-02-10 ENCOUNTER — Ambulatory Visit (INDEPENDENT_AMBULATORY_CARE_PROVIDER_SITE_OTHER): Payer: 59 | Admitting: Obstetrics and Gynecology

## 2023-02-10 ENCOUNTER — Encounter: Payer: Self-pay | Admitting: Obstetrics and Gynecology

## 2023-02-10 VITALS — BP 104/70 | Ht 63.0 in | Wt 144.0 lb

## 2023-02-10 DIAGNOSIS — N951 Menopausal and female climacteric states: Secondary | ICD-10-CM

## 2023-02-10 DIAGNOSIS — Z01419 Encounter for gynecological examination (general) (routine) without abnormal findings: Secondary | ICD-10-CM

## 2023-02-10 DIAGNOSIS — Z13220 Encounter for screening for lipoid disorders: Secondary | ICD-10-CM

## 2023-02-10 DIAGNOSIS — Z0000 Encounter for general adult medical examination without abnormal findings: Secondary | ICD-10-CM

## 2023-02-10 DIAGNOSIS — R899 Unspecified abnormal finding in specimens from other organs, systems and tissues: Secondary | ICD-10-CM

## 2023-02-10 DIAGNOSIS — Z1231 Encounter for screening mammogram for malignant neoplasm of breast: Secondary | ICD-10-CM

## 2023-02-10 DIAGNOSIS — Z79890 Hormone replacement therapy (postmenopausal): Secondary | ICD-10-CM

## 2023-02-10 DIAGNOSIS — F5101 Primary insomnia: Secondary | ICD-10-CM

## 2023-02-10 DIAGNOSIS — E7800 Pure hypercholesterolemia, unspecified: Secondary | ICD-10-CM

## 2023-02-10 MED ORDER — ZOLPIDEM TARTRATE 10 MG PO TABS: ORAL_TABLET | ORAL | 5 refills | Status: DC

## 2023-02-10 MED ORDER — PROGESTERONE MICRONIZED 100 MG PO CAPS: ORAL_CAPSULE | ORAL | 3 refills | Status: DC

## 2023-02-10 NOTE — Patient Instructions (Signed)
I value your feedback and you entrusting us with your care. If you get a [facility] patient survey, I would appreciate you taking the time to let us know about your experience today. Thank you!  [facility]: [phone]      

## 2023-02-11 LAB — COMPREHENSIVE METABOLIC PANEL
ALT: 19 IU/L (ref 0–32)
AST: 24 IU/L (ref 0–40)
Albumin/Globulin Ratio: 2 (ref 1.2–2.2)
Albumin: 4.9 g/dL (ref 3.8–4.9)
Alkaline Phosphatase: 61 IU/L (ref 44–121)
BUN/Creatinine Ratio: 24 — ABNORMAL HIGH (ref 9–23)
BUN: 23 mg/dL (ref 6–24)
Bilirubin Total: 0.2 mg/dL (ref 0.0–1.2)
CO2: 15 mmol/L — ABNORMAL LOW (ref 20–29)
Calcium: 10 mg/dL (ref 8.7–10.2)
Chloride: 113 mmol/L — ABNORMAL HIGH (ref 96–106)
Creatinine, Ser: 0.95 mg/dL (ref 0.57–1.00)
Globulin, Total: 2.5 g/dL (ref 1.5–4.5)
Glucose: 86 mg/dL (ref 70–99)
Potassium: 4.3 mmol/L (ref 3.5–5.2)
Sodium: 145 mmol/L — ABNORMAL HIGH (ref 134–144)
Total Protein: 7.4 g/dL (ref 6.0–8.5)
eGFR: 70 mL/min/{1.73_m2} (ref >59)

## 2023-02-11 LAB — LIPID PANEL WITH LDL/HDL RATIO
Cholesterol, Total: 224 mg/dL — ABNORMAL HIGH (ref 100–199)
HDL: 53 mg/dL (ref >39)
LDL Chol Calc (NIH): 155 mg/dL — ABNORMAL HIGH (ref 0–99)
LDL/HDL Ratio: 2.9 ratio (ref 0.0–3.2)
Triglycerides: 89 mg/dL (ref 0–149)
VLDL Cholesterol Cal: 16 mg/dL (ref 5–40)

## 2023-02-13 NOTE — Addendum Note (Signed)
Addended by: Ardeth Perfect B on: [id number] 02:56 PM   Modules accepted: Orders

## 2023-02-14 ENCOUNTER — Encounter: Payer: Self-pay | Admitting: Obstetrics and Gynecology

## 2023-03-30 ENCOUNTER — Other Ambulatory Visit: Payer: 59

## 2023-03-30 DIAGNOSIS — R899 Unspecified abnormal finding in specimens from other organs, systems and tissues: Secondary | ICD-10-CM

## 2023-03-31 LAB — COMPREHENSIVE METABOLIC PANEL
ALT: 20 IU/L (ref 0–32)
AST: 19 IU/L (ref 0–40)
Albumin/Globulin Ratio: 2 (ref 1.2–2.2)
Albumin: 4.5 g/dL (ref 3.8–4.9)
Alkaline Phosphatase: 56 IU/L (ref 44–121)
BUN/Creatinine Ratio: 18 (ref 9–23)
BUN: 16 mg/dL (ref 6–24)
Bilirubin Total: 0.3 mg/dL (ref 0.0–1.2)
CO2: 25 mmol/L (ref 20–29)
Calcium: 9.8 mg/dL (ref 8.7–10.2)
Chloride: 104 mmol/L (ref 96–106)
Creatinine, Ser: 0.87 mg/dL (ref 0.57–1.00)
Globulin, Total: 2.2 g/dL (ref 1.5–4.5)
Glucose: 86 mg/dL (ref 70–99)
Potassium: 4.5 mmol/L (ref 3.5–5.2)
Sodium: 143 mmol/L (ref 134–144)
Total Protein: 6.7 g/dL (ref 6.0–8.5)
eGFR: 78 mL/min/{1.73_m2} (ref >59)

## 2023-04-07 ENCOUNTER — Ambulatory Visit
Admission: RE | Admit: 2023-04-07 | Discharge: 2023-04-07 | Disposition: A | Discharge status: Home / Self Care | Payer: 59 | Source: Ambulatory Visit | Attending: Obstetrics and Gynecology | Admitting: Obstetrics and Gynecology

## 2023-04-07 DIAGNOSIS — Z1231 Encounter for screening mammogram for malignant neoplasm of breast: Secondary | ICD-10-CM | POA: Diagnosis not present

## 2023-08-29 ENCOUNTER — Other Ambulatory Visit: Payer: Self-pay | Admitting: Obstetrics and Gynecology

## 2023-08-29 DIAGNOSIS — F5101 Primary insomnia: Secondary | ICD-10-CM

## 2024-02-11 NOTE — Progress Notes (Unsigned)
 PCP: Rica Records, PA-C   No chief complaint on file.   HPI:      [name] is a 58 y.o. [id number] who LMP was Patient's last menstrual period was 08/05/2017., presents today for her annual examination.  Her menses are absent due to menopause. She does not have PMB. LMP 9/18.  She does have vasomotor sx that are improved with prometrium 150 mg dose (alternates 1-2 caps). Started 11/20. Would like to continue.   Sex activity: single partner. She does not have vaginal dryness. No pain/bleeding.   Last Pap: 01/28/22  Results were: no abnormalities /neg HPV DNA.  Hx of STDs: none  Last mammogram: 04/07/23 Results were: normal--routine follow-up in 12 months There is no FH of breast cancer. There is no FH of ovarian cancer. The patient does not do self-breast exams.  Colonoscopy: neg Cologuard 2/20 and 3/23;  repeat after 3 yrs.   Tobacco use: The patient denies current or previous tobacco use. Alcohol use: none  No drug use Exercise: moderately active  She does get adequate calcium and Vitamin D in her diet.  She takes ambien 5 mg nightly for insomnia and needs RF currently.   Normal lipids/labs 2/21, 3/23 and 3/24. FH MI in her dad at age 76. Had neg cardio eval with Dr. Gwen Pounds, taking ASA 81 mg daily. Due for labs today.    Past Medical History:  Diagnosis Date   Family history of heart disease in female family member before age 2    History of mammogram [id number]; 11/18/16   birads 1; neg   History of Papanicolaou smear of cervix 08/31/12; 11/22/15   -/-; -/-   Insomnia    Macular degeneration    Menstrual migraine    Screening for colon cancer 12/2018   neg Cologuard, repeat in 3 yrs    Past Surgical History:  Procedure Laterality Date   OPEN REDUCTION INTERNAL FIXATION (ORIF) TIBIA/FIBULA FRACTURE  1999    Family History  Problem Relation Age of Onset   Alzheimer's disease Mother        doesn't carry genetic gene   Hyperlipidemia Mother     Hypertension Mother    Heart disease Father 40       MI   CVA Maternal Grandfather    CVA Paternal Grandfather    Breast cancer Neg Hx     Social History   Socioeconomic History   Marital status: Married    Spouse name: Not on file   Number of children: 2   Years of education: 14   Highest education level: Not on file  Occupational History   Occupation: OFFICE MANAGER  Tobacco Use   Smoking status: Never   Smokeless tobacco: Never  Vaping Use   Vaping status: Never Used  Substance and Sexual Activity   Alcohol use: No   Drug use: No   Sexual activity: Yes    Birth control/protection: Post-menopausal  Other Topics Concern   Not on file  Social History Narrative   Not on file   Social Drivers of Health   Financial Resource Strain: Not on file  Food Insecurity: Not on file  Transportation Needs: Not on file  Physical Activity: Not on file  Stress: Not on file  Social Connections: Not on file  Intimate Partner Violence: Not on file    No outpatient medications have been marked as taking for the 02/14/24 encounter (Appointment) with Savyon Loken, Ilona Sorrel, PA-C.  ROS:  Review of Systems  Constitutional:  Negative for fatigue, fever and unexpected weight change.  Respiratory:  Negative for cough, shortness of breath and wheezing.   Cardiovascular:  Negative for chest pain, palpitations and leg swelling.  Gastrointestinal:  Negative for blood in stool, constipation, diarrhea, nausea and vomiting.  Endocrine: Negative for cold intolerance, heat intolerance and polyuria.  Genitourinary:  Negative for dyspareunia, dysuria, flank pain, frequency, genital sores, hematuria, menstrual problem, pelvic pain, urgency, vaginal bleeding, vaginal discharge and vaginal pain.  Musculoskeletal:  Negative for arthralgias, back pain, joint swelling and myalgias.  Skin:  Negative for rash.  Neurological:  Negative for dizziness, syncope, light-headedness, numbness and headaches.   Hematological:  Negative for adenopathy.  Psychiatric/Behavioral:  Negative for agitation, confusion, sleep disturbance and suicidal ideas. The patient is not nervous/anxious.      Objective: LMP 08/05/2017    Physical Exam Constitutional:      Appearance: She is well-developed.  Genitourinary:     Vulva normal.     Right Labia: No rash, tenderness or lesions.    Left Labia: No tenderness, lesions or rash.    No vaginal discharge, erythema or tenderness.      Right Adnexa: not tender and no mass present.    Left Adnexa: not tender and no mass present.    No cervical motion tenderness, friability or polyp.     Uterus is not enlarged or tender.  Breasts:    Right: No mass, nipple discharge, skin change or tenderness.     Left: No mass, nipple discharge, skin change or tenderness.  Neck:     Thyroid: No thyromegaly.  Cardiovascular:     Rate and Rhythm: Normal rate and regular rhythm.     Heart sounds: Normal heart sounds. No murmur heard. Pulmonary:     Effort: Pulmonary effort is normal.     Breath sounds: Normal breath sounds.  Abdominal:     Palpations: Abdomen is soft.     Tenderness: There is no abdominal tenderness. There is no guarding or rebound.  Musculoskeletal:        General: Normal range of motion.     Cervical back: Normal range of motion.  Lymphadenopathy:     Cervical: No cervical adenopathy.  Neurological:     General: No focal deficit present.     Mental Status: She is alert and oriented to person, place, and time.     Cranial Nerves: No cranial nerve deficit.  Skin:    General: Skin is warm and dry.  Psychiatric:        Mood and Affect: Mood normal.        Behavior: Behavior normal.        Thought Content: Thought content normal.        Judgment: Judgment normal.  Vitals reviewed.     Assessment/Plan: Encounter for annual routine gynecological examination  Encounter for screening mammogram for malignant neoplasm of breast - Plan: MM 3D  SCREENING MAMMOGRAM BILATERAL BREAST; pt to schedule mammo  Hormone replacement therapy (HRT) - Plan: progesterone (PROMETRIUM) 100 MG capsule  Vasomotor symptoms due to menopause - Plan: progesterone (PROMETRIUM) 100 MG capsule; Rx RF  Primary insomnia - Plan: zolpidem (AMBIEN) 10 MG tablet; Rx RF, takes 1/2 tab.   Blood tests for routine general physical examination - Plan: Comprehensive metabolic panel, Lipid Panel With LDL/HDL Ratio  Elevated LDL cholesterol level - Plan: Lipid Panel With LDL/HDL Ratio  Screening cholesterol level - Plan: Lipid Panel With LDL/HDL  Ratio'          No orders of the defined types were placed in this encounter.   GYN counsel breast self exam, mammography screening, menopause, adequate intake of calcium and vitamin D, diet and exercise    F/U  No follow-ups on file.  Batya Citron B. Naila Elizondo, PA-C 02/11/2024 1:41 PM

## 2024-02-14 ENCOUNTER — Ambulatory Visit (INDEPENDENT_AMBULATORY_CARE_PROVIDER_SITE_OTHER): Payer: 59 | Admitting: Obstetrics and Gynecology

## 2024-02-14 ENCOUNTER — Encounter: Payer: Self-pay | Admitting: Obstetrics and Gynecology

## 2024-02-14 VITALS — BP 130/72 | HR 64 | Ht 62.0 in | Wt 153.0 lb

## 2024-02-14 DIAGNOSIS — Z1231 Encounter for screening mammogram for malignant neoplasm of breast: Secondary | ICD-10-CM

## 2024-02-14 DIAGNOSIS — Z13220 Encounter for screening for lipoid disorders: Secondary | ICD-10-CM

## 2024-02-14 DIAGNOSIS — Z01419 Encounter for gynecological examination (general) (routine) without abnormal findings: Secondary | ICD-10-CM

## 2024-02-14 DIAGNOSIS — E7800 Pure hypercholesterolemia, unspecified: Secondary | ICD-10-CM | POA: Insufficient documentation

## 2024-02-14 DIAGNOSIS — N951 Menopausal and female climacteric states: Secondary | ICD-10-CM

## 2024-02-14 DIAGNOSIS — Z0000 Encounter for general adult medical examination without abnormal findings: Secondary | ICD-10-CM

## 2024-02-14 DIAGNOSIS — Z79890 Hormone replacement therapy (postmenopausal): Secondary | ICD-10-CM

## 2024-02-14 DIAGNOSIS — F5101 Primary insomnia: Secondary | ICD-10-CM

## 2024-02-14 MED ORDER — PROGESTERONE MICRONIZED 100 MG PO CAPS: ORAL_CAPSULE | ORAL | 3 refills | Status: AC

## 2024-02-14 MED ORDER — ZOLPIDEM TARTRATE 10 MG PO TABS: ORAL_TABLET | ORAL | 5 refills | Status: DC

## 2024-02-14 NOTE — Patient Instructions (Signed)
 I value your feedback and you entrusting [place] with your care. If you get a [facility] patient survey, I would appreciate you taking the time to let us know about your experience today. Thank you!  [facility] ([place])--[phone]

## 2024-02-15 LAB — COMPREHENSIVE METABOLIC PANEL
ALT: 28 IU/L (ref 0–32)
AST: 26 IU/L (ref 0–40)
Albumin: 4.7 g/dL (ref 3.8–4.9)
Alkaline Phosphatase: 54 IU/L (ref 44–121)
BUN/Creatinine Ratio: 16 (ref 9–23)
BUN: 15 mg/dL (ref 6–24)
Bilirubin Total: 0.4 mg/dL (ref 0.0–1.2)
CO2: 25 mmol/L (ref 20–29)
Calcium: 9.8 mg/dL (ref 8.7–10.2)
Chloride: 104 mmol/L (ref 96–106)
Creatinine, Ser: 0.93 mg/dL (ref 0.57–1.00)
Globulin, Total: 2.2 g/dL (ref 1.5–4.5)
Glucose: 80 mg/dL (ref 70–99)
Potassium: 4.4 mmol/L (ref 3.5–5.2)
Sodium: 143 mmol/L (ref 134–144)
Total Protein: 6.9 g/dL (ref 6.0–8.5)
eGFR: 72 mL/min/{1.73_m2} (ref >59)

## 2024-02-15 LAB — CBC WITH DIFFERENTIAL/PLATELET
Basophils Absolute: 0.1 10*3/uL (ref 0.0–0.2)
Basos: 1 %
EOS (ABSOLUTE): 0.2 10*3/uL (ref 0.0–0.4)
Eos: 3 %
Hematocrit: 46.5 % (ref 34.0–46.6)
Hemoglobin: 15.1 g/dL (ref 11.1–15.9)
Immature Grans (Abs): 0 10*3/uL (ref 0.0–0.1)
Immature Granulocytes: 0 %
Lymphocytes Absolute: 2.7 10*3/uL (ref 0.7–3.1)
Lymphs: 44 %
MCH: 28.9 pg (ref 26.6–33.0)
MCHC: 32.5 g/dL (ref 31.5–35.7)
MCV: 89 fL (ref 79–97)
Monocytes Absolute: 0.3 10*3/uL (ref 0.1–0.9)
Monocytes: 5 %
Neutrophils Absolute: 2.9 10*3/uL (ref 1.4–7.0)
Neutrophils: 47 %
Platelets: 188 10*3/uL (ref 150–450)
RBC: 5.22 x10E6/uL (ref 3.77–5.28)
RDW: 13.1 % (ref 11.7–15.4)
WBC: 6.2 10*3/uL (ref 3.4–10.8)

## 2024-02-15 LAB — LIPID PANEL
Chol/HDL Ratio: 4.5 ratio — ABNORMAL HIGH (ref 0.0–4.4)
Cholesterol, Total: 213 mg/dL — ABNORMAL HIGH (ref 100–199)
HDL: 47 mg/dL (ref >39)
LDL Chol Calc (NIH): 149 mg/dL — ABNORMAL HIGH (ref 0–99)
Triglycerides: 92 mg/dL (ref 0–149)
VLDL Cholesterol Cal: 17 mg/dL (ref 5–40)

## 2024-02-16 ENCOUNTER — Encounter: Payer: Self-pay | Admitting: Obstetrics and Gynecology

## 2024-03-25 ENCOUNTER — Other Ambulatory Visit: Payer: Self-pay | Admitting: Obstetrics and Gynecology

## 2024-03-25 DIAGNOSIS — F5101 Primary insomnia: Secondary | ICD-10-CM

## 2024-04-10 ENCOUNTER — Ambulatory Visit
Admission: RE | Admit: 2024-04-10 | Discharge: 2024-04-10 | Disposition: A | Discharge status: Home / Self Care | Source: Ambulatory Visit | Attending: Obstetrics and Gynecology | Admitting: Obstetrics and Gynecology

## 2024-04-10 DIAGNOSIS — Z1231 Encounter for screening mammogram for malignant neoplasm of breast: Secondary | ICD-10-CM | POA: Insufficient documentation

## 2024-04-15 ENCOUNTER — Ambulatory Visit: Payer: Self-pay | Admitting: Obstetrics and Gynecology

## 2024-08-30 ENCOUNTER — Other Ambulatory Visit: Payer: Self-pay | Admitting: Obstetrics and Gynecology

## 2024-08-30 DIAGNOSIS — F5101 Primary insomnia: Secondary | ICD-10-CM

## 2024-08-30 NOTE — Telephone Encounter (Signed)
 Cadience called triage trying to get her Amiben refilled. I asked her has she been to her pharmacy for her 5 refills she stated the bottle she has in front of was last filled on 08/01/24 with zero refills. I advised her Bernarda was out of the office and I would send her a message. Pt understood

## 2024-08-31 ENCOUNTER — Other Ambulatory Visit: Payer: Self-pay | Admitting: Obstetrics and Gynecology

## 2024-08-31 DIAGNOSIS — F5101 Primary insomnia: Secondary | ICD-10-CM

## 2024-08-31 MED ORDER — ZOLPIDEM TARTRATE 10 MG PO TABS: ORAL_TABLET | ORAL | 5 refills | Status: AC

## 2024-08-31 NOTE — Progress Notes (Signed)
 Rx RF  zolpidem  for 6 months. Annual due 3/26

## 2024-08-31 NOTE — Telephone Encounter (Signed)
Rx RF eRxd.
